# Patient Record
Sex: Female | Born: 1960
Health system: Southern US, Community
[De-identification: ages and names within clinical notes are randomized; demographics above are authoritative.]

## PROBLEM LIST (undated history)

## (undated) ENCOUNTER — Ambulatory Visit (HOSPITAL_COMMUNITY): Disposition: A | Discharge status: Other Acute Inpatient Hospital | Payer: BLUE CROSS/BLUE SHIELD

## (undated) DIAGNOSIS — R221 Localized swelling, mass and lump, neck: Secondary | ICD-10-CM

## (undated) DIAGNOSIS — I10 Essential (primary) hypertension: Secondary | ICD-10-CM

## (undated) DIAGNOSIS — F329 Major depressive disorder, single episode, unspecified: Secondary | ICD-10-CM

## (undated) DIAGNOSIS — E7800 Pure hypercholesterolemia, unspecified: Secondary | ICD-10-CM

## (undated) DIAGNOSIS — F411 Generalized anxiety disorder: Secondary | ICD-10-CM

## (undated) DIAGNOSIS — D509 Iron deficiency anemia, unspecified: Secondary | ICD-10-CM

## (undated) DIAGNOSIS — J984 Other disorders of lung: Secondary | ICD-10-CM

## (undated) DIAGNOSIS — R220 Localized swelling, mass and lump, head: Secondary | ICD-10-CM

## (undated) DIAGNOSIS — M1990 Unspecified osteoarthritis, unspecified site: Secondary | ICD-10-CM

## (undated) HISTORY — DX: Other disorders of lung: J98.4

## (undated) HISTORY — DX: Localized swelling, mass and lump, neck: R22.1

## (undated) HISTORY — DX: Iron deficiency anemia, unspecified: D50.9

## (undated) HISTORY — PX: TUBAL LIGATION: SHX77

## (undated) HISTORY — DX: Generalized anxiety disorder: F41.1

## (undated) HISTORY — DX: Localized swelling, mass and lump, head: R22.0

## (undated) HISTORY — DX: Pure hypercholesterolemia, unspecified: E78.00

## (undated) HISTORY — DX: Essential (primary) hypertension: I10

## (undated) HISTORY — DX: Major depressive disorder, single episode, unspecified: F32.9

## (undated) HISTORY — DX: Unspecified osteoarthritis, unspecified site: M19.90

## (undated) HISTORY — PX: BUNIONECTOMY: SHX129

## (undated) HISTORY — PX: OTHER SURGICAL HISTORY: SHX169

---

## 1997-06-22 ENCOUNTER — Ambulatory Visit (HOSPITAL_COMMUNITY): Admission: RE | Admit: 1997-06-22 | Discharge: 1997-06-22 | Payer: Self-pay | Admitting: Endocrinology

## 1997-12-22 ENCOUNTER — Emergency Department (HOSPITAL_COMMUNITY): Admission: EM | Admit: 1997-12-22 | Discharge: 1997-12-22 | Payer: Self-pay | Admitting: Emergency Medicine

## 1998-03-27 ENCOUNTER — Emergency Department (HOSPITAL_COMMUNITY): Admission: EM | Admit: 1998-03-27 | Discharge: 1998-03-28 | Payer: Self-pay | Admitting: Emergency Medicine

## 1998-03-28 ENCOUNTER — Encounter: Payer: Self-pay | Admitting: Emergency Medicine

## 1998-06-13 ENCOUNTER — Other Ambulatory Visit: Admission: RE | Admit: 1998-06-13 | Discharge: 1998-06-13 | Payer: Self-pay | Admitting: Family Medicine

## 1998-07-05 ENCOUNTER — Encounter: Payer: Self-pay | Admitting: Endocrinology

## 1998-07-05 ENCOUNTER — Ambulatory Visit (HOSPITAL_COMMUNITY): Admission: RE | Admit: 1998-07-05 | Discharge: 1998-07-05 | Payer: Self-pay | Admitting: Pediatrics

## 1999-04-05 ENCOUNTER — Ambulatory Visit (HOSPITAL_COMMUNITY): Admission: RE | Admit: 1999-04-05 | Discharge: 1999-04-05 | Payer: Self-pay | Admitting: Obstetrics and Gynecology

## 1999-07-09 ENCOUNTER — Ambulatory Visit (HOSPITAL_COMMUNITY): Admission: RE | Admit: 1999-07-09 | Discharge: 1999-07-09 | Payer: Self-pay | Admitting: Obstetrics and Gynecology

## 1999-07-09 ENCOUNTER — Encounter: Payer: Self-pay | Admitting: Obstetrics and Gynecology

## 1999-07-24 ENCOUNTER — Other Ambulatory Visit: Admission: RE | Admit: 1999-07-24 | Discharge: 1999-07-24 | Payer: Self-pay | Admitting: Obstetrics and Gynecology

## 2000-05-28 ENCOUNTER — Encounter: Payer: Self-pay | Admitting: Endocrinology

## 2000-05-28 ENCOUNTER — Ambulatory Visit (HOSPITAL_COMMUNITY): Admission: RE | Admit: 2000-05-28 | Discharge: 2000-05-28 | Payer: Self-pay | Admitting: Endocrinology

## 2000-07-10 ENCOUNTER — Encounter: Payer: Self-pay | Admitting: Obstetrics and Gynecology

## 2000-07-10 ENCOUNTER — Ambulatory Visit (HOSPITAL_COMMUNITY): Admission: RE | Admit: 2000-07-10 | Discharge: 2000-07-10 | Payer: Self-pay | Admitting: Obstetrics and Gynecology

## 2000-07-31 ENCOUNTER — Other Ambulatory Visit: Admission: RE | Admit: 2000-07-31 | Discharge: 2000-07-31 | Payer: Self-pay | Admitting: Obstetrics and Gynecology

## 2000-11-13 ENCOUNTER — Emergency Department (HOSPITAL_COMMUNITY): Admission: EM | Admit: 2000-11-13 | Discharge: 2000-11-13 | Payer: Self-pay | Admitting: Emergency Medicine

## 2001-07-13 ENCOUNTER — Encounter: Payer: Self-pay | Admitting: Obstetrics and Gynecology

## 2001-07-13 ENCOUNTER — Ambulatory Visit (HOSPITAL_COMMUNITY): Admission: RE | Admit: 2001-07-13 | Discharge: 2001-07-13 | Payer: Self-pay | Admitting: Obstetrics and Gynecology

## 2001-08-20 ENCOUNTER — Other Ambulatory Visit: Admission: RE | Admit: 2001-08-20 | Discharge: 2001-08-20 | Payer: Self-pay | Admitting: Obstetrics and Gynecology

## 2002-08-18 ENCOUNTER — Ambulatory Visit (HOSPITAL_COMMUNITY): Admission: RE | Admit: 2002-08-18 | Discharge: 2002-08-18 | Payer: Self-pay | Admitting: Obstetrics and Gynecology

## 2002-08-18 ENCOUNTER — Encounter: Payer: Self-pay | Admitting: Obstetrics and Gynecology

## 2003-08-10 ENCOUNTER — Other Ambulatory Visit: Admission: RE | Admit: 2003-08-10 | Discharge: 2003-08-10 | Payer: Self-pay | Admitting: Obstetrics and Gynecology

## 2003-08-24 ENCOUNTER — Ambulatory Visit (HOSPITAL_COMMUNITY): Admission: RE | Admit: 2003-08-24 | Discharge: 2003-08-24 | Payer: Self-pay | Admitting: Endocrinology

## 2004-09-19 ENCOUNTER — Ambulatory Visit (HOSPITAL_COMMUNITY): Admission: RE | Admit: 2004-09-19 | Discharge: 2004-09-19 | Payer: Self-pay | Admitting: Endocrinology

## 2005-05-06 ENCOUNTER — Emergency Department (HOSPITAL_COMMUNITY): Admission: EM | Admit: 2005-05-06 | Discharge: 2005-05-06 | Payer: Self-pay | Admitting: Emergency Medicine

## 2005-09-13 ENCOUNTER — Ambulatory Visit: Payer: Self-pay | Admitting: Endocrinology

## 2005-09-18 ENCOUNTER — Ambulatory Visit: Payer: Self-pay | Admitting: Endocrinology

## 2005-10-02 ENCOUNTER — Ambulatory Visit (HOSPITAL_COMMUNITY): Admission: RE | Admit: 2005-10-02 | Discharge: 2005-10-02 | Payer: Self-pay | Admitting: Endocrinology

## 2006-10-08 ENCOUNTER — Ambulatory Visit (HOSPITAL_COMMUNITY): Admission: RE | Admit: 2006-10-08 | Discharge: 2006-10-08 | Payer: Self-pay | Admitting: Endocrinology

## 2006-10-10 ENCOUNTER — Ambulatory Visit: Payer: Self-pay | Admitting: Endocrinology

## 2006-10-10 LAB — CONVERTED CEMR LAB
Albumin: 3.8 g/dL (ref 3.5–5.2)
Alkaline Phosphatase: 62 units/L (ref 39–117)
BUN: 9 mg/dL (ref 6–23)
Basophils Relative: 0.8 % (ref 0.0–1.0)
Bilirubin Urine: NEGATIVE
Calcium: 9.1 mg/dL (ref 8.4–10.5)
Chloride: 108 meq/L (ref 96–112)
Cholesterol: 216 mg/dL (ref 0–200)
Creatinine, Ser: 0.8 mg/dL (ref 0.4–1.2)
Eosinophils Absolute: 0.2 10*3/uL (ref 0.0–0.6)
Eosinophils Relative: 2.9 % (ref 0.0–5.0)
GFR calc Af Amer: 98 mL/min
GFR calc non Af Amer: 81 mL/min
Glucose, Bld: 88 mg/dL (ref 70–99)
HCT: 36.8 % (ref 36.0–46.0)
Ketones, ur: NEGATIVE mg/dL
MCV: 87.9 fL (ref 78.0–100.0)
Monocytes Absolute: 0.1 10*3/uL — ABNORMAL LOW (ref 0.2–0.7)
Monocytes Relative: 2.6 % — ABNORMAL LOW (ref 3.0–11.0)
Neutro Abs: 3.3 10*3/uL (ref 1.4–7.7)
Sodium: 139 meq/L (ref 135–145)
Total Bilirubin: 0.5 mg/dL (ref 0.3–1.2)
Total CHOL/HDL Ratio: 3.7
Total Protein: 7.5 g/dL (ref 6.0–8.3)
Urine Glucose: NEGATIVE mg/dL
pH: 6 (ref 5.0–8.0)

## 2006-10-15 ENCOUNTER — Ambulatory Visit: Payer: Self-pay | Admitting: Endocrinology

## 2007-02-21 ENCOUNTER — Encounter: Payer: Self-pay | Admitting: Endocrinology

## 2007-02-21 DIAGNOSIS — F32A Depression, unspecified: Secondary | ICD-10-CM | POA: Insufficient documentation

## 2007-02-21 DIAGNOSIS — F329 Major depressive disorder, single episode, unspecified: Secondary | ICD-10-CM | POA: Insufficient documentation

## 2007-02-21 DIAGNOSIS — F3289 Other specified depressive episodes: Secondary | ICD-10-CM

## 2007-02-21 DIAGNOSIS — F411 Generalized anxiety disorder: Secondary | ICD-10-CM

## 2007-02-21 DIAGNOSIS — M1990 Unspecified osteoarthritis, unspecified site: Secondary | ICD-10-CM | POA: Insufficient documentation

## 2007-02-21 DIAGNOSIS — F419 Anxiety disorder, unspecified: Secondary | ICD-10-CM | POA: Insufficient documentation

## 2007-02-21 HISTORY — DX: Other specified depressive episodes: F32.89

## 2007-02-21 HISTORY — DX: Unspecified osteoarthritis, unspecified site: M19.90

## 2007-02-21 HISTORY — DX: Generalized anxiety disorder: F41.1

## 2007-02-21 HISTORY — DX: Major depressive disorder, single episode, unspecified: F32.9

## 2007-10-01 ENCOUNTER — Ambulatory Visit: Payer: Self-pay | Admitting: Endocrinology

## 2007-10-01 DIAGNOSIS — R220 Localized swelling, mass and lump, head: Secondary | ICD-10-CM | POA: Insufficient documentation

## 2007-10-01 DIAGNOSIS — R221 Localized swelling, mass and lump, neck: Secondary | ICD-10-CM

## 2007-10-01 HISTORY — DX: Localized swelling, mass and lump, head: R22.0

## 2007-10-01 LAB — CONVERTED CEMR LAB
BUN: 7 mg/dL (ref 6–23)
Creatinine, Ser: 1 mg/dL (ref 0.4–1.2)

## 2007-10-05 ENCOUNTER — Ambulatory Visit: Payer: Self-pay | Admitting: Cardiovascular Disease

## 2007-10-15 ENCOUNTER — Ambulatory Visit (HOSPITAL_COMMUNITY): Admission: RE | Admit: 2007-10-15 | Discharge: 2007-10-15 | Payer: Self-pay | Admitting: Endocrinology

## 2007-11-30 ENCOUNTER — Ambulatory Visit: Payer: Self-pay | Admitting: Internal Medicine

## 2007-11-30 DIAGNOSIS — R5383 Other fatigue: Secondary | ICD-10-CM

## 2007-11-30 DIAGNOSIS — R5381 Other malaise: Secondary | ICD-10-CM | POA: Insufficient documentation

## 2007-12-02 ENCOUNTER — Ambulatory Visit: Payer: Self-pay | Admitting: Endocrinology

## 2007-12-03 LAB — CONVERTED CEMR LAB
Albumin: 3.7 g/dL (ref 3.5–5.2)
Alkaline Phosphatase: 74 units/L (ref 39–117)
BUN: 13 mg/dL (ref 6–23)
Basophils Absolute: 0.1 10*3/uL (ref 0.0–0.1)
Bilirubin Urine: NEGATIVE
CO2: 26 meq/L (ref 19–32)
Calcium: 9.2 mg/dL (ref 8.4–10.5)
Direct LDL: 146.3 mg/dL
Eosinophils Relative: 3.5 % (ref 0.0–5.0)
GFR calc Af Amer: 86 mL/min
GFR calc non Af Amer: 71 mL/min
Glucose, Bld: 84 mg/dL (ref 70–99)
Hemoglobin, Urine: NEGATIVE
Monocytes Relative: 5.9 % (ref 3.0–12.0)
Neutrophils Relative %: 50.3 % (ref 43.0–77.0)
Sodium: 140 meq/L (ref 135–145)
TSH: 1.11 microintl units/mL (ref 0.35–5.50)
Total Bilirubin: 0.4 mg/dL (ref 0.3–1.2)
Urine Glucose: NEGATIVE mg/dL

## 2007-12-08 ENCOUNTER — Ambulatory Visit: Payer: Self-pay | Admitting: Endocrinology

## 2007-12-08 ENCOUNTER — Telehealth (INDEPENDENT_AMBULATORY_CARE_PROVIDER_SITE_OTHER): Payer: Self-pay | Admitting: *Deleted

## 2007-12-08 DIAGNOSIS — E7800 Pure hypercholesterolemia, unspecified: Secondary | ICD-10-CM | POA: Insufficient documentation

## 2007-12-08 HISTORY — DX: Pure hypercholesterolemia, unspecified: E78.00

## 2008-04-05 ENCOUNTER — Encounter: Payer: Self-pay | Admitting: Internal Medicine

## 2008-04-05 DIAGNOSIS — J984 Other disorders of lung: Secondary | ICD-10-CM | POA: Insufficient documentation

## 2008-04-05 HISTORY — DX: Other disorders of lung: J98.4

## 2008-04-11 ENCOUNTER — Ambulatory Visit: Payer: Self-pay | Admitting: Internal Medicine

## 2008-04-11 LAB — CONVERTED CEMR LAB: Creatinine, Ser: 0.8 mg/dL (ref 0.4–1.2)

## 2008-10-17 ENCOUNTER — Ambulatory Visit (HOSPITAL_COMMUNITY): Admission: RE | Admit: 2008-10-17 | Discharge: 2008-10-17 | Payer: Self-pay | Admitting: Endocrinology

## 2009-01-20 ENCOUNTER — Ambulatory Visit: Payer: Self-pay | Admitting: Endocrinology

## 2009-01-20 LAB — CONVERTED CEMR LAB
ALT: 10 units/L (ref 0–35)
AST: 17 units/L (ref 0–37)
BUN: 7 mg/dL (ref 6–23)
Basophils Relative: 0.5 % (ref 0.0–3.0)
Bilirubin, Direct: 0 mg/dL (ref 0.0–0.3)
Chloride: 110 meq/L (ref 96–112)
Cholesterol: 159 mg/dL (ref 0–200)
Creatinine, Ser: 0.7 mg/dL (ref 0.4–1.2)
Eosinophils Relative: 5.4 % — ABNORMAL HIGH (ref 0.0–5.0)
GFR calc non Af Amer: 113.75 mL/min (ref >60)
Ketones, ur: NEGATIVE mg/dL
Leukocytes, UA: NEGATIVE
Monocytes Absolute: 0.5 10*3/uL (ref 0.1–1.0)
Neutrophils Relative %: 50.4 % (ref 43.0–77.0)
Platelets: 296 10*3/uL (ref 150.0–400.0)
Potassium: 4.1 meq/L (ref 3.5–5.1)
RBC: 4.13 M/uL (ref 3.87–5.11)
Sodium: 142 meq/L (ref 135–145)
Specific Gravity, Urine: 1.025 (ref 1.000–1.030)
Total Bilirubin: 0.6 mg/dL (ref 0.3–1.2)
Total Protein: 7.6 g/dL (ref 6.0–8.3)
Urobilinogen, UA: 0.2 (ref 0.0–1.0)
pH: 6 (ref 5.0–8.0)

## 2009-01-23 ENCOUNTER — Ambulatory Visit: Payer: Self-pay | Admitting: Endocrinology

## 2009-01-23 DIAGNOSIS — D509 Iron deficiency anemia, unspecified: Secondary | ICD-10-CM

## 2009-01-23 HISTORY — DX: Iron deficiency anemia, unspecified: D50.9

## 2009-02-01 ENCOUNTER — Ambulatory Visit: Payer: Self-pay | Admitting: Gastroenterology

## 2009-02-13 ENCOUNTER — Ambulatory Visit: Payer: Self-pay | Admitting: Gastroenterology

## 2009-03-17 ENCOUNTER — Ambulatory Visit: Payer: Self-pay | Admitting: Internal Medicine

## 2009-03-17 DIAGNOSIS — R05 Cough: Secondary | ICD-10-CM

## 2009-03-17 DIAGNOSIS — R059 Cough, unspecified: Secondary | ICD-10-CM | POA: Insufficient documentation

## 2009-03-20 LAB — CONVERTED CEMR LAB
Basophils Absolute: 0 10*3/uL (ref 0.0–0.1)
Basophils Relative: 0.3 % (ref 0.0–3.0)
Eosinophils Relative: 5.2 % — ABNORMAL HIGH (ref 0.0–5.0)
Hemoglobin: 11.8 g/dL — ABNORMAL LOW (ref 12.0–15.0)
Lymphocytes Relative: 34.2 % (ref 12.0–46.0)
Lymphs Abs: 2.8 10*3/uL (ref 0.7–4.0)
Monocytes Absolute: 0.4 10*3/uL (ref 0.1–1.0)
Monocytes Relative: 5.2 % (ref 3.0–12.0)
Platelets: 306 10*3/uL (ref 150.0–400.0)
RBC: 4.14 M/uL (ref 3.87–5.11)
RDW: 15.2 % — ABNORMAL HIGH (ref 11.5–14.6)

## 2009-04-06 ENCOUNTER — Encounter: Payer: Self-pay | Admitting: Internal Medicine

## 2009-04-10 ENCOUNTER — Ambulatory Visit: Payer: Self-pay | Admitting: Cardiology

## 2009-11-20 ENCOUNTER — Ambulatory Visit (HOSPITAL_COMMUNITY): Admission: RE | Admit: 2009-11-20 | Discharge: 2009-11-20 | Payer: Self-pay | Admitting: Endocrinology

## 2010-01-26 ENCOUNTER — Ambulatory Visit: Payer: Self-pay | Admitting: Endocrinology

## 2010-01-26 LAB — CONVERTED CEMR LAB
Basophils Relative: 0.9 % (ref 0.0–3.0)
Bilirubin, Direct: 0.1 mg/dL (ref 0.0–0.3)
Calcium: 9 mg/dL (ref 8.4–10.5)
Chloride: 104 meq/L (ref 96–112)
Cholesterol: 214 mg/dL — ABNORMAL HIGH (ref 0–200)
Creatinine, Ser: 0.8 mg/dL (ref 0.4–1.2)
Eosinophils Absolute: 0.4 10*3/uL (ref 0.0–0.7)
Eosinophils Relative: 5.9 % — ABNORMAL HIGH (ref 0.0–5.0)
HDL: 53.3 mg/dL (ref >39.00)
Hemoglobin: 13.1 g/dL (ref 12.0–15.0)
Leukocytes, UA: NEGATIVE
Lymphs Abs: 2.9 10*3/uL (ref 0.7–4.0)
Monocytes Absolute: 0.4 10*3/uL (ref 0.1–1.0)
Monocytes Relative: 6.9 % (ref 3.0–12.0)
Neutrophils Relative %: 41.4 % — ABNORMAL LOW (ref 43.0–77.0)
Nitrite: NEGATIVE
Platelets: 295 10*3/uL (ref 150.0–400.0)
Potassium: 4.3 meq/L (ref 3.5–5.1)
RBC: 4.4 M/uL (ref 3.87–5.11)
RDW: 15 % — ABNORMAL HIGH (ref 11.5–14.6)
Total Protein: 7.3 g/dL (ref 6.0–8.3)
Triglycerides: 88 mg/dL (ref 0.0–149.0)

## 2010-01-29 ENCOUNTER — Ambulatory Visit: Payer: Self-pay | Admitting: Endocrinology

## 2010-01-29 ENCOUNTER — Encounter: Payer: Self-pay | Admitting: Endocrinology

## 2010-01-29 DIAGNOSIS — I10 Essential (primary) hypertension: Secondary | ICD-10-CM

## 2010-01-29 HISTORY — DX: Essential (primary) hypertension: I10

## 2010-06-03 LAB — CONVERTED CEMR LAB
Basophils Relative: 0.7 % (ref 0.0–3.0)
Eosinophils Relative: 5.6 % — ABNORMAL HIGH (ref 0.0–5.0)
HCT: 35.4 % — ABNORMAL LOW (ref 36.0–46.0)
Hemoglobin: 11.7 g/dL — ABNORMAL LOW (ref 12.0–15.0)
Iron: 38 ug/dL — ABNORMAL LOW (ref 42–145)
Lymphocytes Relative: 33.9 % (ref 12.0–46.0)
Lymphs Abs: 2.3 10*3/uL (ref 0.7–4.0)
MCV: 84.4 fL (ref 78.0–100.0)
Monocytes Absolute: 0.5 10*3/uL (ref 0.1–1.0)
Monocytes Relative: 6.7 % (ref 3.0–12.0)
RBC: 4.19 M/uL (ref 3.87–5.11)
Saturation Ratios: 10.4 % — ABNORMAL LOW (ref 20.0–50.0)

## 2010-06-07 NOTE — Assessment & Plan Note (Signed)
Summary: Cpx / Bcbs / #/ cd   Vital Signs:  Patient profile:   50 year old female Height:      65 inches (165.10 cm) Weight:      283.38 pounds (128.81 kg) BMI:     47.33 O2 Sat:      97 % on Room air Temp:     98.5 degrees F (36.94 degrees C) oral Pulse rate:   94 / minute BP sitting:   152 / 88  (left arm) Cuff size:   large  Vitals Entered By: Brenton Grills MA (January 29, 2010 9:47 AM)  O2 Flow:  Room air CC: Physical/pt is no longer taking Tessalon/aj   Primary Provider:  Minus Breeding MD  CC:  Physical/pt is no longer taking Tessalon/aj.  History of Present Illness: here for regular wellness examination.  she's feeling pretty well in general, and does not smoke. alcohol is occasional.   Current Medications (verified): 1)  Tessalon Perles 100 Mg Caps (Benzonatate) .Marland Kitchen.. 1 By Mouth Three Times A Day As Needed For Cough  Allergies (verified): No Known Drug Allergies  Family History: Reviewed history from 11/30/2007 and no changes required. sister had breast cancer Dm with mother, brother, 2 sisters  Social History: Reviewed history from 11/30/2007 and no changes required. Married 3 children, 1 died premature work - Clinical research associate - [employer] Never Smoked Alcohol use-yes - rare  Review of Systems  The patient denies fever, weight loss, weight gain, vision loss, decreased hearing, chest pain, syncope, prolonged cough, headaches, abdominal pain, melena, hematochezia, severe indigestion/heartburn, hematuria, and suspicious skin lesions.    Physical Exam  General:  morbidly obese.   Head:  head: no deformity eyes: no periorbital swelling, no proptosis external nose and ears are normal mouth: no lesion seen Neck:  Supple without thyroid enlargement or tenderness.  Breasts:  sees gyn  Heart:  Regular rate and rhythm without murmurs or gallops noted. Normal S1,S2.   Abdomen:  abdomen is soft, nontender.  no hepatosplenomegaly.   not distended.  no  hernia  Rectal:  sees gyn  Genitalia:  sees gyn  Msk:  muscle bulk and strength are grossly normal.  no obvious joint swelling.  gait is normal and steady  Extremities:  no deformity.  no ulcer on the feet.  feet are of normal color and temp.  no edema  Neurologic:  cn 2-12 grossly intact.   readily moves all 4's.   sensation is intact to touch on the feet  Skin:  normal texture and temp.  no rash.  not diaphoretic  Cervical Nodes:  No significant adenopathy.  Psych:  Alert and cooperative; normal mood and affect; normal attention span and concentration.   Additional Exam:  SEPARATE EVALUATION FOLLOWS--EACH PROBLEM HERE IS NEW, NOT RESPONDING TO TREATMENT, OR POSES SIGNIFICANT RISK TO THE PATIENT'S HEALTH: HISTORY OF THE PRESENT ILLNESS: pt states she feels well in general, except for work-related anxiety PAST MEDICAL HISTORY reviewed and up to date today REVIEW OF SYSTEMS: denies sob PHYSICAL EXAMINATION: dorsalis pedis intact bilat.  no carotid bruit clear to auscultation.  no respiratory distress see vs page LAB/XRAY RESULTS: elev ldl is noted IMPRESSION: htn, needs increased rx dyslipidemia, needs increased rx PLAN: see instruction page    Impression & Recommendations:  Problem # 1:  ROUTINE GENERAL MEDICAL EXAM@HEALTH  CARE FACL (ICD-V70.0)  Other Orders: EKG w/ Interpretation (93000) Est. Patient Level III (16109) Est. Patient 40-64 years (60454)  Preventive Care Screening  gyn is dr cousins   Patient Instructions: 1)  you should take an aspirin every day, unless you have been advised by a doctor not to. 2)  good diet and exercise habits significanly improve the control of your blood pressure and cholesterol.  please let me know if you wish to be referred to a dietician.  you should see an eye doctor every year. 3)  let me know if you decide to take the medication for cholesterol and/or blood pressure. 4)  cc dr cousins. 5)  please consider these  measures for your health:  minimize alcohol.  do not use tobacco products.  have a colonoscopy at least every 10 years from age 24.  keep firearms safely stored.  always use seat belts.  have working smoke alarms in your home.  see an eye doctor and dentist regularly.  never drive under the influence of alcohol or drugs (including prescription drugs).

## 2010-11-06 ENCOUNTER — Other Ambulatory Visit: Payer: Self-pay | Admitting: Endocrinology

## 2010-11-06 DIAGNOSIS — Z1231 Encounter for screening mammogram for malignant neoplasm of breast: Secondary | ICD-10-CM

## 2010-11-26 ENCOUNTER — Ambulatory Visit (HOSPITAL_COMMUNITY)
Admission: RE | Admit: 2010-11-26 | Discharge: 2010-11-26 | Disposition: A | Discharge status: Home / Self Care | Payer: BC Managed Care – PPO | Source: Ambulatory Visit | Attending: Endocrinology | Admitting: Endocrinology

## 2010-11-26 DIAGNOSIS — Z1231 Encounter for screening mammogram for malignant neoplasm of breast: Secondary | ICD-10-CM

## 2011-01-30 ENCOUNTER — Other Ambulatory Visit: Payer: Self-pay | Admitting: *Deleted

## 2011-01-30 DIAGNOSIS — Z0000 Encounter for general adult medical examination without abnormal findings: Secondary | ICD-10-CM

## 2011-02-04 ENCOUNTER — Other Ambulatory Visit (INDEPENDENT_AMBULATORY_CARE_PROVIDER_SITE_OTHER): Payer: BC Managed Care – PPO

## 2011-02-04 ENCOUNTER — Other Ambulatory Visit: Payer: BC Managed Care – PPO

## 2011-02-04 DIAGNOSIS — Z0000 Encounter for general adult medical examination without abnormal findings: Secondary | ICD-10-CM

## 2011-02-04 LAB — HEPATIC FUNCTION PANEL
AST: 20 U/L (ref 0–37)
Albumin: 4 g/dL (ref 3.5–5.2)
Bilirubin, Direct: 0 mg/dL (ref 0.0–0.3)
Total Bilirubin: 0.2 mg/dL — ABNORMAL LOW (ref 0.3–1.2)

## 2011-02-04 LAB — CBC WITH DIFFERENTIAL/PLATELET
Hemoglobin: 13 g/dL (ref 12.0–15.0)
Lymphocytes Relative: 35.6 % (ref 12.0–46.0)
Lymphs Abs: 2.5 10*3/uL (ref 0.7–4.0)
MCHC: 32.5 g/dL (ref 30.0–36.0)
MCV: 88.1 fl (ref 78.0–100.0)
Neutro Abs: 3.6 10*3/uL (ref 1.4–7.7)
Neutrophils Relative %: 51.6 % (ref 43.0–77.0)
RBC: 4.55 Mil/uL (ref 3.87–5.11)
RDW: 15 % — ABNORMAL HIGH (ref 11.5–14.6)

## 2011-02-04 LAB — BASIC METABOLIC PANEL
CO2: 27 mEq/L (ref 19–32)
Calcium: 9.1 mg/dL (ref 8.4–10.5)
Chloride: 106 mEq/L (ref 96–112)
Creatinine, Ser: 0.9 mg/dL (ref 0.4–1.2)
GFR: 86.65 mL/min (ref >60.00)
Glucose, Bld: 85 mg/dL (ref 70–99)
Potassium: 4 mEq/L (ref 3.5–5.1)

## 2011-02-04 LAB — URINALYSIS, ROUTINE W REFLEX MICROSCOPIC
Bilirubin Urine: NEGATIVE
Hgb urine dipstick: NEGATIVE
Nitrite: NEGATIVE
Specific Gravity, Urine: 1.015 (ref 1.000–1.030)
Total Protein, Urine: NEGATIVE
Urobilinogen, UA: 0.2 (ref 0.0–1.0)
pH: 6 (ref 5.0–8.0)

## 2011-02-04 LAB — LIPID PANEL: Triglycerides: 54 mg/dL (ref 0.0–149.0)

## 2011-02-05 ENCOUNTER — Encounter: Payer: Self-pay | Admitting: Endocrinology

## 2011-02-05 ENCOUNTER — Ambulatory Visit (INDEPENDENT_AMBULATORY_CARE_PROVIDER_SITE_OTHER): Payer: BC Managed Care – PPO | Admitting: Endocrinology

## 2011-02-05 VITALS — BP 132/84 | HR 100 | Temp 97.9°F | Ht 65.0 in | Wt 282.0 lb

## 2011-02-05 DIAGNOSIS — J984 Other disorders of lung: Secondary | ICD-10-CM

## 2011-02-05 DIAGNOSIS — Z0000 Encounter for general adult medical examination without abnormal findings: Secondary | ICD-10-CM

## 2011-02-05 NOTE — Patient Instructions (Addendum)
please consider these measures for your health:  minimize alcohol.  do not use tobacco products.  have a colonoscopy at least every 10 years from age 52.  Women should have an annual mammogram from age 33.  keep firearms safely stored.  always use seat belts.  have working smoke alarms in your home.  see an eye doctor and dentist regularly.  never drive under the influence of alcohol or drugs (including prescription drugs).   good diet and exercise habits significanly improve the control of your diabetes.  please let me know if you wish to be referred to a dietician, or for weight-loss surgery.   Please return in 1 year.

## 2011-02-05 NOTE — Progress Notes (Signed)
Subjective:    Patient ID: Ashley Duncan, female    DOB: April 20, 1961, 50 y.o.   MRN: [id number]  HPI here for regular wellness examination.  He's feeling pretty well in general, except for chronic arthralgias of the knees.  chronic med probs are stable.  She does not want medication for cholesterol.  Gyn is dr cousins.  She still has intermittent menses.  She is in the process of a marital separation.   Past Medical History  Diagnosis Date  . HYPERCHOLESTEROLEMIA 12/08/2007  . ANEMIA, IRON DEFICIENCY 01/23/2009  . ANXIETY 02/21/2007  . DEPRESSION 02/21/2007  . HYPERTENSION 01/29/2010  . PULMONARY NODULE, RIGHT UPPER LOBE 04/05/2008  . OSTEOARTHRITIS 02/21/2007    C Spine, Bilateral knees, heel spurs, right [place] osteophyte on CT 10/2007  . SWELLING MASS OR LUMP IN HEAD AND NECK 10/01/2007    Past Surgical History  Procedure Date  . Tubal ligation     History   Social History  . Marital Status: Married    Spouse Name: N/A    Number of Children: 3  . Years of Education: N/A   Occupational History  . Collections Specialist     [employer]   Social History Main Topics  . Smoking status: Never Smoker   . Smokeless tobacco: Not on file  . Alcohol Use: Yes     rare  . Drug Use:   . Sexually Active:    Other Topics Concern  . Not on file   Social History Narrative   3 children, 1 died premature    No current outpatient prescriptions on file prior to visit.    No Known Allergies  Family History  Problem Relation Age of Onset  . Diabetes Mother   . Cancer Sister     Breast Cancer  . Diabetes Sister   . Diabetes Brother   . Diabetes Sister     BP 132/84  Pulse 100  Temp(Src) 97.9 F (36.6 C) (Oral)  Ht 5\' 5"  (1.651 m)  Wt 282 lb (127.914 kg)  BMI 46.93 kg/m2  SpO2 98%  LMP 09/08/2010    Review of Systems  Constitutional: Negative for fever and unexpected weight change.  HENT: Negative for hearing loss.   Eyes: Negative for visual disturbance.  Respiratory:  Negative for chest tightness.   Cardiovascular: Negative for chest pain.  Gastrointestinal: Negative for anal bleeding.  Genitourinary: Negative for hematuria.  Musculoskeletal: Negative for back pain.  Skin: Negative for rash.  Neurological: Negative for syncope and headaches.  Hematological: Does not bruise/bleed easily.  Psychiatric/Behavioral: Negative for dysphoric mood.       Objective:   Physical Exam VS: see vs page GEN: no distress HEAD: head: no deformity eyes: no periorbital swelling, no proptosis external nose and ears are normal mouth: no lesion seen NECK: supple, thyroid is not enlarged CHEST WALL: no deformity LUNGS:  Clear to auscultation BREASTS:  sees gyn CV: reg rate and rhythm, no murmur ABD: abdomen is soft, nontender.  no hepatosplenomegaly.  not distended.  no hernia GENITALIA:  sees gyn RECTAL: sees gyn MUSCULOSKELETAL: muscle bulk and strength are grossly normal.  no obvious joint swelling.  gait is normal and steady EXTEMITIES: no deformity.  no ulcer on the feet.  feet are of normal color and temp.  no edema PULSES: dorsalis pedis intact bilat.  no carotid bruit NEURO:  cn 2-12 grossly intact.   readily moves all 4's.  sensation is intact to touch on the feet SKIN:  Normal texture  and temperature.  No rash or suspicious lesion is visible.   NODES:  None palpable at the neck PSYCH: alert, oriented x3.  Does not appear anxious nor depressed.        Assessment & Plan:  Wellness visit today, with problems stable, except as noted.

## 2011-12-11 ENCOUNTER — Other Ambulatory Visit: Payer: Self-pay | Admitting: Endocrinology

## 2012-01-15 ENCOUNTER — Other Ambulatory Visit: Payer: Self-pay | Admitting: Endocrinology

## 2012-01-15 DIAGNOSIS — Z1231 Encounter for screening mammogram for malignant neoplasm of breast: Secondary | ICD-10-CM

## 2012-01-27 ENCOUNTER — Ambulatory Visit (HOSPITAL_COMMUNITY)
Admission: RE | Admit: 2012-01-27 | Discharge: 2012-01-27 | Disposition: A | Discharge status: Home / Self Care | Payer: Self-pay | Source: Ambulatory Visit | Attending: Endocrinology | Admitting: Endocrinology

## 2012-01-27 DIAGNOSIS — Z1231 Encounter for screening mammogram for malignant neoplasm of breast: Secondary | ICD-10-CM

## 2012-02-06 ENCOUNTER — Ambulatory Visit (INDEPENDENT_AMBULATORY_CARE_PROVIDER_SITE_OTHER): Payer: Self-pay | Admitting: Endocrinology

## 2012-02-06 ENCOUNTER — Encounter: Payer: Self-pay | Admitting: Endocrinology

## 2012-02-06 VITALS — BP 140/90 | HR 81 | Temp 98.2°F | Resp 16 | Wt 287.2 lb

## 2012-02-06 DIAGNOSIS — I10 Essential (primary) hypertension: Secondary | ICD-10-CM

## 2012-02-06 NOTE — Progress Notes (Signed)
  Subjective:    Patient ID: Ashley Duncan, female    DOB: 12-Sep-1960, 51 y.o.   MRN: [id number]  HPI Pt says she has neverrequired any medication for HTN.  pt states she feels well in general, except for anxiety.   Past Medical History  Diagnosis Date  . HYPERCHOLESTEROLEMIA 12/08/2007  . ANEMIA, IRON DEFICIENCY 01/23/2009  . ANXIETY 02/21/2007  . DEPRESSION 02/21/2007  . HYPERTENSION 01/29/2010  . PULMONARY NODULE, RIGHT UPPER LOBE 04/05/2008  . OSTEOARTHRITIS 02/21/2007    C Spine, Bilateral knees, heel spurs, right [place] osteophyte on CT 10/2007  . SWELLING MASS OR LUMP IN HEAD AND NECK 10/01/2007    Past Surgical History  Procedure Date  . Tubal ligation     History   Social History  . Marital Status: Married    Spouse Name: N/A    Number of Children: 3  . Years of Education: N/A   Occupational History  . Collections Specialist     [employer]   Social History Main Topics  . Smoking status: Never Smoker   . Smokeless tobacco: Not on file  . Alcohol Use: Yes     rare  . Drug Use:   . Sexually Active:    Other Topics Concern  . Not on file   Social History Narrative   3 children, 1 died premature    No current outpatient prescriptions on file prior to visit.    No Known Allergies  Family History  Problem Relation Age of Onset  . Diabetes Mother   . Cancer Sister     Breast Cancer  . Diabetes Sister   . Diabetes Brother   . Diabetes Sister     BP 140/90  Pulse 81  Temp 98.2 F (36.8 C) (Oral)  Resp 16  Wt 287 lb 4 oz (130.296 kg)  SpO2 96%  LMP 09/08/2010  Review of Systems Denies chest pain and sob.    Objective:   Physical Exam VITAL SIGNS:  See vs page GENERAL: no distress LUNGS:  Clear to auscultation HEART:  Regular rate and rhythm without murmurs noted. Normal S1,S2.      Assessment & Plan:  HTN.  She will probably need medication at some point, but she can try diet and exercise for now.

## 2012-02-06 NOTE — Patient Instructions (Addendum)
good diet and exercise significanly improve the control of your blood pressure.  Please come back when you get your insurance, or sooner if necessary.

## 2012-05-18 ENCOUNTER — Encounter: Payer: Self-pay | Admitting: Endocrinology

## 2012-05-18 ENCOUNTER — Ambulatory Visit (INDEPENDENT_AMBULATORY_CARE_PROVIDER_SITE_OTHER): Payer: Self-pay | Admitting: Endocrinology

## 2012-05-18 VITALS — BP 138/84 | HR 78 | Wt 285.0 lb

## 2012-05-18 DIAGNOSIS — R200 Anesthesia of skin: Secondary | ICD-10-CM | POA: Insufficient documentation

## 2012-05-18 DIAGNOSIS — IMO0001 Reserved for inherently not codable concepts without codable children: Secondary | ICD-10-CM

## 2012-05-18 DIAGNOSIS — M791 Myalgia, unspecified site: Secondary | ICD-10-CM

## 2012-05-18 DIAGNOSIS — R209 Unspecified disturbances of skin sensation: Secondary | ICD-10-CM

## 2012-05-18 LAB — CK: Total CK: 231 U/L — ABNORMAL HIGH (ref 7–177)

## 2012-05-18 NOTE — Patient Instructions (Addendum)
blood tests are being requested for you today.  We'll contact you with results.  

## 2012-05-18 NOTE — Progress Notes (Signed)
  Subjective:    Patient ID: Ashley Duncan, female    DOB: Apr 19, 1961, 52 y.o.   MRN: [id number]  HPI Pt states few mos of moderate numbness of both hands, and assoc pain.  She has difficulty with concentration and headache.  She thinks she has lyme dz, as she had several tick bites last summer.   Past Medical History  Diagnosis Date  . HYPERCHOLESTEROLEMIA 12/08/2007  . ANEMIA, IRON DEFICIENCY 01/23/2009  . ANXIETY 02/21/2007  . DEPRESSION 02/21/2007  . HYPERTENSION 01/29/2010  . PULMONARY NODULE, RIGHT UPPER LOBE 04/05/2008  . OSTEOARTHRITIS 02/21/2007    C Spine, Bilateral knees, heel spurs, right [place] osteophyte on CT 10/2007  . SWELLING MASS OR LUMP IN HEAD AND NECK 10/01/2007    Past Surgical History  Procedure Date  . Tubal ligation     History   Social History  . Marital Status: Married    Spouse Name: N/A    Number of Children: 3  . Years of Education: N/A   Occupational History  . Collections Specialist     [employer]   Social History Main Topics  . Smoking status: Never Smoker   . Smokeless tobacco: Not on file  . Alcohol Use: Yes     Comment: rare  . Drug Use:   . Sexually Active:    Other Topics Concern  . Not on file   Social History Narrative   3 children, 1 died premature    No current outpatient prescriptions on file prior to visit.    No Known Allergies  Family History  Problem Relation Age of Onset  . Diabetes Mother   . Cancer Sister     Breast Cancer  . Diabetes Sister   . Diabetes Brother   . Diabetes Sister     BP 138/84  Pulse 78  Wt 285 lb (129.275 kg)  SpO2 95%  LMP 09/08/2010  Review of Systems He has myalgias and nasal congestion, and low-grade fever.     Objective:   Physical Exam VITAL SIGNS:  See vs page GENERAL: no distress head: no deformity eyes: no periorbital swelling, no proptosis external nose and ears are normal mouth: no lesion seen Both eac's and tm's are normal.   LUNGS:  Clear to auscultation HEART:   Regular rate and rhythm without murmurs noted. Normal S1,S2.   Muscles: nontender Skin: no rash Neuro: sensation is intact to touch on the hands.    Lyme titer is neg    Assessment & Plan:  Hand pain, prob due to CTS.  Pt says that's what she thought it was. Myalgias, new.  uncertain etiology

## 2012-06-20 ENCOUNTER — Other Ambulatory Visit: Payer: Self-pay

## 2012-08-19 ENCOUNTER — Telehealth: Payer: Self-pay | Admitting: Endocrinology

## 2012-08-19 NOTE — Telephone Encounter (Signed)
recv'd records request from Disability. Forwarded by fax to HIM @[email or web address]  by fax# [phone] / Oneita Kras.

## 2013-03-11 ENCOUNTER — Other Ambulatory Visit: Payer: Self-pay

## 2013-07-12 ENCOUNTER — Other Ambulatory Visit: Payer: Self-pay | Admitting: Endocrinology

## 2013-07-12 DIAGNOSIS — Z1231 Encounter for screening mammogram for malignant neoplasm of breast: Secondary | ICD-10-CM

## 2013-07-14 ENCOUNTER — Other Ambulatory Visit: Payer: Self-pay

## 2013-07-14 ENCOUNTER — Other Ambulatory Visit (INDEPENDENT_AMBULATORY_CARE_PROVIDER_SITE_OTHER): Payer: BC Managed Care – PPO

## 2013-07-14 DIAGNOSIS — Z0000 Encounter for general adult medical examination without abnormal findings: Secondary | ICD-10-CM

## 2013-07-14 LAB — URINALYSIS, ROUTINE W REFLEX MICROSCOPIC
Bilirubin Urine: NEGATIVE
Hgb urine dipstick: NEGATIVE
Ketones, ur: NEGATIVE
LEUKOCYTES UA: NEGATIVE
NITRITE: NEGATIVE
RBC / HPF: NONE SEEN (ref 0–?)
SPECIFIC GRAVITY, URINE: 1.01 (ref 1.000–1.030)
Total Protein, Urine: NEGATIVE
UROBILINOGEN UA: 0.2 (ref 0.0–1.0)
Urine Glucose: NEGATIVE
WBC UA: NONE SEEN (ref 0–?)
pH: 7 (ref 5.0–8.0)

## 2013-07-14 LAB — BASIC METABOLIC PANEL
BUN: 12 mg/dL (ref 6–23)
CALCIUM: 9.1 mg/dL (ref 8.4–10.5)
CHLORIDE: 105 meq/L (ref 96–112)
CO2: 26 mEq/L (ref 19–32)
CREATININE: 0.9 mg/dL (ref 0.4–1.2)
GFR: 88.16 mL/min (ref >60.00)
Glucose, Bld: 86 mg/dL (ref 70–99)
Potassium: 3.7 mEq/L (ref 3.5–5.1)
SODIUM: 138 meq/L (ref 135–145)

## 2013-07-14 LAB — CBC WITH DIFFERENTIAL/PLATELET
BASOS PCT: 0.6 % (ref 0.0–3.0)
Basophils Absolute: 0 10*3/uL (ref 0.0–0.1)
EOS PCT: 5.7 % — AB (ref 0.0–5.0)
Eosinophils Absolute: 0.3 10*3/uL (ref 0.0–0.7)
HCT: 39.5 % (ref 36.0–46.0)
HEMOGLOBIN: 12.7 g/dL (ref 12.0–15.0)
LYMPHS PCT: 39.9 % (ref 12.0–46.0)
Lymphs Abs: 2.4 10*3/uL (ref 0.7–4.0)
MCHC: 32.1 g/dL (ref 30.0–36.0)
MCV: 88.8 fl (ref 78.0–100.0)
Monocytes Absolute: 0.4 10*3/uL (ref 0.1–1.0)
Monocytes Relative: 6.5 % (ref 3.0–12.0)
NEUTROS ABS: 2.9 10*3/uL (ref 1.4–7.7)
NEUTROS PCT: 47.3 % (ref 43.0–77.0)
Platelets: 282 10*3/uL (ref 150.0–400.0)
RBC: 4.45 Mil/uL (ref 3.87–5.11)
RDW: 14.3 % (ref 11.5–14.6)
WBC: 6.1 10*3/uL (ref 4.5–10.5)

## 2013-07-14 LAB — LIPID PANEL
CHOL/HDL RATIO: 4
Cholesterol: 197 mg/dL (ref 0–200)
HDL: 52 mg/dL (ref >39.00)
LDL CALC: 133 mg/dL — AB (ref 0–99)
Triglycerides: 58 mg/dL (ref 0.0–149.0)
VLDL: 11.6 mg/dL (ref 0.0–40.0)

## 2013-07-14 LAB — HEMOGLOBIN A1C: HEMOGLOBIN A1C: 6.1 % (ref 4.6–6.5)

## 2013-07-14 LAB — TSH: TSH: 1.27 u[IU]/mL (ref 0.35–5.50)

## 2013-07-15 ENCOUNTER — Other Ambulatory Visit: Payer: Self-pay

## 2013-07-15 LAB — HEPATIC FUNCTION PANEL (6)
ALBUMIN: 4.2 g/dL (ref 3.5–5.5)
ALK PHOS: 88 IU/L (ref 39–117)
ALT: 21 IU/L (ref 0–32)
AST: 27 IU/L (ref 0–40)
BILIRUBIN DIRECT: 0.07 mg/dL (ref 0.00–0.40)
Total Bilirubin: 0.2 mg/dL (ref 0.0–1.2)

## 2013-07-16 ENCOUNTER — Encounter: Payer: Self-pay | Admitting: Endocrinology

## 2013-07-16 ENCOUNTER — Ambulatory Visit (INDEPENDENT_AMBULATORY_CARE_PROVIDER_SITE_OTHER): Payer: BC Managed Care – PPO | Admitting: Endocrinology

## 2013-07-16 VITALS — BP 126/88 | HR 82 | Temp 98.1°F | Ht 65.0 in | Wt 270.0 lb

## 2013-07-16 DIAGNOSIS — R7309 Other abnormal glucose: Secondary | ICD-10-CM

## 2013-07-16 DIAGNOSIS — R739 Hyperglycemia, unspecified: Secondary | ICD-10-CM | POA: Insufficient documentation

## 2013-07-16 DIAGNOSIS — E7800 Pure hypercholesterolemia, unspecified: Secondary | ICD-10-CM

## 2013-07-16 NOTE — Patient Instructions (Signed)
please consider these measures for your health:  minimize alcohol.  do not use tobacco products.  have a colonoscopy at least every 10 years from age 55.  Women should have an annual mammogram from age 40.  keep firearms safely stored.  always use seat belts.  have working smoke alarms in your home.  see an eye doctor and dentist regularly.  never drive under the influence of alcohol or drugs (including prescription drugs).   Please return in 1 year. 

## 2013-07-16 NOTE — Progress Notes (Signed)
Subjective:    Patient ID: Ashley MiyamotoMattie Duncan, female    DOB: 02-22-1961, 53 y.o.   MRN: [id number]  HPI Pt is here for regular wellness examination, and is feeling pretty well in general, and says chronic med probs are stable, except as noted below.  She declined medication for cholesterol. Past Medical History  Diagnosis Date  . HYPERCHOLESTEROLEMIA 12/08/2007  . ANEMIA, IRON DEFICIENCY 01/23/2009  . ANXIETY 02/21/2007  . DEPRESSION 02/21/2007  . HYPERTENSION 01/29/2010  . PULMONARY NODULE, RIGHT UPPER LOBE 04/05/2008  . OSTEOARTHRITIS 02/21/2007    C Spine, Bilateral knees, heel spurs, right [place] osteophyte on CT 10/2007  . SWELLING MASS OR LUMP IN HEAD AND NECK 10/01/2007    Past Surgical History  Procedure Laterality Date  . Tubal ligation      History   Social History  . Marital Status: Married    Spouse Name: N/A    Number of Children: 3  . Years of Education: N/A   Occupational History  . Collections Specialist     [employer]   Social History Main Topics  . Smoking status: Never Smoker   . Smokeless tobacco: Not on file  . Alcohol Use: Yes     Comment: rare  . Drug Use:   . Sexual Activity:    Other Topics Concern  . Not on file   Social History Narrative   3 children, 1 died premature    No current outpatient prescriptions on file prior to visit.   No current facility-administered medications on file prior to visit.    No Known Allergies  Family History  Problem Relation Age of Onset  . Diabetes Mother   . Cancer Sister     Breast Cancer  . Diabetes Sister   . Diabetes Brother   . Diabetes Sister     BP 126/88  Pulse 82  Temp(Src) 98.1 F (36.7 C) (Oral)  Ht 5\' 5"  (1.651 m)  Wt 270 lb (122.471 kg)  BMI 44.93 kg/m2  SpO2 97%  LMP 09/08/2010  Review of Systems  Constitutional: Negative for fever.  HENT: Negative for hearing loss.   Eyes: Negative for visual disturbance.  Respiratory: Negative for shortness of breath.   Cardiovascular:  Negative for chest pain.  Gastrointestinal: Negative for anal bleeding.  Endocrine: Negative for cold intolerance.  Genitourinary: Negative for hematuria.  Musculoskeletal: Positive for back pain.  Skin: Negative for rash.  Allergic/Immunologic: Negative for environmental allergies.  Neurological: Negative for syncope.  Hematological: Does not bruise/bleed easily.  Psychiatric/Behavioral: Negative for dysphoric mood.       Objective:   Physical Exam VS: see vs page GEN: no distress HEAD: head: no deformity eyes: no periorbital swelling, no proptosis external nose and ears are normal mouth: no lesion seen NECK: supple, thyroid is not enlarged CHEST WALL: no deformity LUNGS:  Clear to auscultation BREASTS:  sees gyn CV: reg rate and rhythm, no murmur ABD: abdomen is soft, nontender.  no hepatosplenomegaly.  not distended.  no hernia GENITALIA/RECTAL: sees gyn MUSCULOSKELETAL: muscle bulk and strength are grossly normal.  no obvious joint swelling.  gait is normal and steady EXTEMITIES: no deformity.  no ulcer on the feet.  feet are of normal color and temp.  no edema PULSES: dorsalis pedis intact bilat.  no carotid bruit NEURO:  cn 2-12 grossly intact.   readily moves all 4's.  sensation is intact to touch on the feet SKIN:  Normal texture and temperature.  No rash or suspicious lesion is  visible.   NODES:  None palpable at the neck PSYCH: alert, well-oriented.  Does not appear anxious nor depressed.   ecg is declined    Assessment & Plan:  Wellness visit today, with problems stable, except as noted.

## 2013-07-21 ENCOUNTER — Ambulatory Visit (HOSPITAL_COMMUNITY)
Admission: RE | Admit: 2013-07-21 | Discharge: 2013-07-21 | Disposition: A | Discharge status: Home / Self Care | Payer: BC Managed Care – PPO | Source: Ambulatory Visit | Attending: Endocrinology | Admitting: Endocrinology

## 2013-07-21 ENCOUNTER — Encounter: Payer: Self-pay | Admitting: Endocrinology

## 2013-07-21 DIAGNOSIS — Z1231 Encounter for screening mammogram for malignant neoplasm of breast: Secondary | ICD-10-CM

## 2014-01-12 ENCOUNTER — Ambulatory Visit (INDEPENDENT_AMBULATORY_CARE_PROVIDER_SITE_OTHER): Payer: BC Managed Care – PPO | Admitting: Family Medicine

## 2014-01-12 VITALS — BP 136/84 | HR 68 | Temp 97.9°F | Resp 16 | Ht 65.0 in | Wt 259.2 lb

## 2014-01-12 DIAGNOSIS — S61409A Unspecified open wound of unspecified hand, initial encounter: Secondary | ICD-10-CM

## 2014-01-12 DIAGNOSIS — S61411A Laceration without foreign body of right hand, initial encounter: Secondary | ICD-10-CM

## 2014-01-12 NOTE — Addendum Note (Signed)
Addended by: Alonza Smoker on: 01/12/2014 08:22 PM   Modules accepted: Level of Service

## 2014-01-12 NOTE — Progress Notes (Signed)
  Ashley Duncan - 53 y.o. female MRN [id number]  Date of birth: [birth year]  SUBJECTIVE:  Including CC & ROS.  patient C/O: about 1 hour ago  Onset of symptoms: patient report she was cutting a watermelon when the knife slicked through the watermelon into the hyperthenar eminence  of the right hand.  Symptoms: pain, mild bleeding, mostly superfericial not deep, no significant bruising or redness Relieving factors: pressure elevation Worsened by: pressure and thumb movement   ROS:  Constitutional:  No fever, chills, or fatigue.  Review of systems otherwise negative except for what is stated in HPI  HISTORY: Past Medical, Surgical, Social, and Family History Reviewed & Updated per EMR. Pertinent Historical Findings include: HTN, non-smoker  PHYSICAL EXAM:  VS: BP:136/84 mmHg  HR:68bpm  TEMP:97.9 F (36.6 C)(Oral)  RESP:98 %  HT:5\' 5"  (165.1 cm)   WT:259 lb 3.2 oz (117.572 kg)  BMI:43.2 HAND EXAM: General: well nourished Skin of UE: warm; dry, no rashes, lesions, ecchymosis or erythema. Vascular: radial pulses 2+ bilaterally Neurologically: Sensation to light touch upper extremities equal and intact bilaterally.        Observation: no palmar or dorsal hand edema. mild swelling, no erythema, mild bruising over hyperthenar eminence   2.5 -3 cm superficial laceration to the mild substance of the hyperthenar eminence  Palpation: no tenderness over each carpal bone or metatarsal, radial styloid pain, no pain over the tendons sheath of each tunnel    ROM: normal ROM in supination and pronation, elbow extension and flexion    Strength: Normal 5/5 strength with extension/ flexion, pronation and supination.     No pain 5/5 strength of each digit in flexion and extension   Special test: stable medial and lateral collateral ligaments of each finger  Wound procedure: Date: 01/12/14  Confirmed: patient, side, site, safety procedures followed.    Performed by: Jed Limerick MARIE DO.      Informed consent:verbalized by patient  Indication: simple superficial laceration of the skin Location:  over the hyperthenar eminence Preparation and technique: skin prepped with soap and water, sterile preparation of site (with draped to expose affected area, eye protection, gloves and mask), local anesthesia 1% Lidocaine without epinephrine 2  Ml used, wound irrigation with low pressure.    Wound assessment:: characteristics (erythematous,no purulent), discharge (bloody small amount <1cc, yellow-colored no), inflammatory changes (redness no, swelling no), pain throbbing mild    Procedure tolerated: fairly.     ASSESSMENT & PLAN:  Simple superficial laceration of the right hyperthenar eminence skin no muscle involvement  Recommendation: - laceration repaired with 3 sutures patient tolerated procedure well.  - No soaking hand in water just wash with soap and water - Suture removal in 7-10 days -Precaution for infection given

## 2014-02-10 ENCOUNTER — Encounter: Payer: Self-pay | Admitting: Endocrinology

## 2014-03-09 ENCOUNTER — Ambulatory Visit (INDEPENDENT_AMBULATORY_CARE_PROVIDER_SITE_OTHER): Payer: BC Managed Care – PPO

## 2014-03-09 DIAGNOSIS — Z23 Encounter for immunization: Secondary | ICD-10-CM

## 2014-04-14 ENCOUNTER — Encounter: Payer: Self-pay | Admitting: Endocrinology

## 2014-09-09 ENCOUNTER — Other Ambulatory Visit: Payer: Self-pay | Admitting: Endocrinology

## 2014-09-09 DIAGNOSIS — Z1231 Encounter for screening mammogram for malignant neoplasm of breast: Secondary | ICD-10-CM

## 2014-09-12 ENCOUNTER — Ambulatory Visit (HOSPITAL_COMMUNITY)
Admission: RE | Admit: 2014-09-12 | Discharge: 2014-09-12 | Disposition: A | Discharge status: Home / Self Care | Payer: 59 | Source: Ambulatory Visit | Attending: Endocrinology | Admitting: Endocrinology

## 2014-09-12 DIAGNOSIS — Z1231 Encounter for screening mammogram for malignant neoplasm of breast: Secondary | ICD-10-CM

## 2014-10-06 ENCOUNTER — Telehealth: Payer: Self-pay

## 2014-10-06 ENCOUNTER — Other Ambulatory Visit (INDEPENDENT_AMBULATORY_CARE_PROVIDER_SITE_OTHER): Payer: 59

## 2014-10-06 DIAGNOSIS — Z0189 Encounter for other specified special examinations: Secondary | ICD-10-CM

## 2014-10-06 DIAGNOSIS — R739 Hyperglycemia, unspecified: Secondary | ICD-10-CM

## 2014-10-06 DIAGNOSIS — Z0000 Encounter for general adult medical examination without abnormal findings: Secondary | ICD-10-CM

## 2014-10-06 LAB — CBC WITH DIFFERENTIAL/PLATELET
BASOS ABS: 0 10*3/uL (ref 0.0–0.1)
BASOS PCT: 0.7 % (ref 0.0–3.0)
EOS ABS: 0.3 10*3/uL (ref 0.0–0.7)
Eosinophils Relative: 4.3 % (ref 0.0–5.0)
HEMATOCRIT: 38.2 % (ref 36.0–46.0)
HEMOGLOBIN: 12.5 g/dL (ref 12.0–15.0)
LYMPHS PCT: 48.3 % — AB (ref 12.0–46.0)
Lymphs Abs: 3 10*3/uL (ref 0.7–4.0)
MCHC: 32.7 g/dL (ref 30.0–36.0)
MCV: 87.7 fl (ref 78.0–100.0)
MONO ABS: 0.4 10*3/uL (ref 0.1–1.0)
MONOS PCT: 6.2 % (ref 3.0–12.0)
NEUTROS PCT: 40.5 % — AB (ref 43.0–77.0)
Neutro Abs: 2.5 10*3/uL (ref 1.4–7.7)
PLATELETS: 280 10*3/uL (ref 150.0–400.0)
RBC: 4.36 Mil/uL (ref 3.87–5.11)
RDW: 14.5 % (ref 11.5–15.5)
WBC: 6.3 10*3/uL (ref 4.0–10.5)

## 2014-10-06 LAB — BASIC METABOLIC PANEL
BUN: 11 mg/dL (ref 6–23)
CALCIUM: 9.2 mg/dL (ref 8.4–10.5)
CHLORIDE: 105 meq/L (ref 96–112)
CO2: 28 mEq/L (ref 19–32)
Creatinine, Ser: 0.83 mg/dL (ref 0.40–1.20)
GFR: 91.44 mL/min (ref >60.00)
Glucose, Bld: 81 mg/dL (ref 70–99)
Potassium: 3.9 mEq/L (ref 3.5–5.1)
SODIUM: 138 meq/L (ref 135–145)

## 2014-10-06 LAB — URINALYSIS, ROUTINE W REFLEX MICROSCOPIC
BILIRUBIN URINE: NEGATIVE
HGB URINE DIPSTICK: NEGATIVE
KETONES UR: NEGATIVE
LEUKOCYTES UA: NEGATIVE
NITRITE: NEGATIVE
PH: 7 (ref 5.0–8.0)
RBC / HPF: NONE SEEN (ref 0–?)
Specific Gravity, Urine: 1.02 (ref 1.000–1.030)
Total Protein, Urine: NEGATIVE
UROBILINOGEN UA: 0.2 (ref 0.0–1.0)
Urine Glucose: NEGATIVE
WBC, UA: NONE SEEN (ref 0–?)

## 2014-10-06 LAB — HEPATIC FUNCTION PANEL
ALK PHOS: 74 U/L (ref 39–117)
ALT: 17 U/L (ref 0–35)
AST: 26 U/L (ref 0–37)
Albumin: 4 g/dL (ref 3.5–5.2)
BILIRUBIN DIRECT: 0.1 mg/dL (ref 0.0–0.3)
BILIRUBIN TOTAL: 0.3 mg/dL (ref 0.2–1.2)
Total Protein: 7.3 g/dL (ref 6.0–8.3)

## 2014-10-06 LAB — LIPID PANEL
CHOL/HDL RATIO: 3
CHOLESTEROL: 187 mg/dL (ref 0–200)
HDL: 53.7 mg/dL (ref >39.00)
LDL CALC: 121 mg/dL — AB (ref 0–99)
NonHDL: 133.3
TRIGLYCERIDES: 62 mg/dL (ref 0.0–149.0)
VLDL: 12.4 mg/dL (ref 0.0–40.0)

## 2014-10-06 LAB — TSH: TSH: 1.94 u[IU]/mL (ref 0.35–4.50)

## 2014-10-06 LAB — HEMOGLOBIN A1C: HEMOGLOBIN A1C: 5.7 % (ref 4.6–6.5)

## 2014-10-06 NOTE — Telephone Encounter (Signed)
Patient is coming in for labs today at 9:45. Please advise what orders should be placed. Thanks!

## 2014-10-06 NOTE — Telephone Encounter (Signed)
i ordered

## 2014-10-10 ENCOUNTER — Ambulatory Visit (INDEPENDENT_AMBULATORY_CARE_PROVIDER_SITE_OTHER): Payer: 59 | Admitting: Endocrinology

## 2014-10-10 ENCOUNTER — Ambulatory Visit
Admission: RE | Admit: 2014-10-10 | Discharge: 2014-10-10 | Disposition: A | Discharge status: Home / Self Care | Payer: 59 | Source: Ambulatory Visit | Attending: Endocrinology | Admitting: Endocrinology

## 2014-10-10 ENCOUNTER — Encounter: Payer: Self-pay | Admitting: Endocrinology

## 2014-10-10 VITALS — BP 136/88 | HR 80 | Temp 98.4°F | Ht 65.0 in | Wt 265.0 lb

## 2014-10-10 DIAGNOSIS — R739 Hyperglycemia, unspecified: Secondary | ICD-10-CM

## 2014-10-10 DIAGNOSIS — K8020 Calculus of gallbladder without cholecystitis without obstruction: Secondary | ICD-10-CM

## 2014-10-10 DIAGNOSIS — M25561 Pain in right knee: Secondary | ICD-10-CM | POA: Diagnosis not present

## 2014-10-10 DIAGNOSIS — Z0000 Encounter for general adult medical examination without abnormal findings: Secondary | ICD-10-CM

## 2014-10-10 NOTE — Patient Instructions (Addendum)
please consider these measures for your health:  minimize alcohol.  do not use tobacco products.  have a colonoscopy at least every 10 years from age 56.  Women should have an annual mammogram from age 56.  keep firearms safely stored.  always use seat belts.  have working smoke alarms in your home.  see an eye doctor and dentist regularly.  never drive under the influence of alcohol or drugs (including prescription drugs).   X-rays are requested for you today.  We'll let you know about the results. Please let me know if you want to see a specialist for your knee. Please return in 1 year.

## 2014-10-10 NOTE — Progress Notes (Signed)
Subjective:    Patient ID: Ashley Duncan, female    DOB: [birth year], 54 y.o.   MRN: [id number]  HPI Pt is here for regular wellness examination, and is feeling pretty well in general, and says chronic med probs are stable, except as noted below Past Medical History  Diagnosis Date  . HYPERCHOLESTEROLEMIA 12/08/2007  . ANEMIA, IRON DEFICIENCY 01/23/2009  . ANXIETY 02/21/2007  . DEPRESSION 02/21/2007  . HYPERTENSION 01/29/2010  . PULMONARY NODULE, RIGHT UPPER LOBE 04/05/2008  . OSTEOARTHRITIS 02/21/2007    C Spine, Bilateral knees, heel spurs, right [place] osteophyte on CT 10/2007  . SWELLING MASS OR LUMP IN HEAD AND NECK 10/01/2007    Past Surgical History  Procedure Laterality Date  . Tubal ligation      History   Social History  . Marital Status: Married    Spouse Name: N/A  . Number of Children: 3  . Years of Education: N/A   Occupational History  . Collections Specialist     [employer]   Social History Main Topics  . Smoking status: Never Smoker   . Smokeless tobacco: Not on file  . Alcohol Use: Yes     Comment: rare  . Drug Use: Not on file  . Sexual Activity: Not on file   Other Topics Concern  . Not on file   Social History Narrative   3 children, 1 died premature    No current outpatient prescriptions on file prior to visit.   No current facility-administered medications on file prior to visit.    No Known Allergies  Family History  Problem Relation Age of Onset  . Diabetes Mother   . Cancer Sister     Breast Cancer  . Diabetes Sister   . Diabetes Brother   . Diabetes Sister     BP 136/88 mmHg  Pulse 80  Temp(Src) 98.4 F (36.9 C) (Oral)  Ht  (1.651 m)  Wt 265 lb (120.203 kg)  BMI 44.10 kg/m2  SpO2 95%  LMP 09/08/2010     Review of Systems  Constitutional: Negative for unexpected weight change.  HENT: Negative for hearing loss.   Eyes: Negative for visual disturbance.  Respiratory: Negative for shortness of breath.     Cardiovascular: Negative for chest pain.  Endocrine: Negative for cold intolerance.  Musculoskeletal: Negative for gait problem.  Skin: Negative for rash.  Allergic/Immunologic: Negative for environmental allergies.  Neurological: Negative for syncope.  Hematological: Does not bruise/bleed easily.  Psychiatric/Behavioral: Negative for dysphoric mood.       Objective:   Physical Exam VS: see vs page GEN: no distress HEAD: head: no deformity eyes: no periorbital swelling, no proptosis external nose and ears are normal mouth: no lesion seen NECK: supple, thyroid is not enlarged CHEST WALL: no deformity LUNGS:  Clear to auscultation BREASTS: sees gyn CV: reg rate and rhythm, no murmur ABD: abdomen is soft, nontender.  no hepatosplenomegaly.  not distended.  no hernia GENITALIA./RECTAL: sees gyn MUSCULOSKELETAL: muscle bulk and strength are grossly normal.  no obvious joint swelling.  EXTEMITIES: no deformity.  no ulcer on the feet.  feet are of normal color and temp.  no edema PULSES: dorsalis pedis intact bilat.  no carotid bruit NEURO:  cn 2-12 grossly intact.   readily moves all 4's.  sensation is intact to touch on the feet SKIN:  Normal texture and temperature.  No rash or suspicious lesion is visible.   NODES:  None palpable at the neck PSYCH: alert, well-oriented.  Does not appear anxious nor depressed.   ecg and DEXA are declined    Assessment & Plan:  Wellness visit today, with problems stable, except as noted.   SEPARATE EVALUATION FOLLOWS--EACH PROBLEM HERE IS NEW, NOT RESPONDING TO TREATMENT, OR POSES SIGNIFICANT RISK TO THE PATIENT'S HEALTH: HISTORY OF THE PRESENT ILLNESS:  Pt sates few years of moderate pain at the right knee, but no assoc numbness.  PAST MEDICAL HISTORY reviewed and up to date today REVIEW OF SYSTEMS: Denies fever PHYSICAL EXAMINATION: VITAL SIGNS:  See vs page GENERAL: no distress Right knee is normal Gait: slightly favors  RLE LAB/XRAY RESULTS: X-ray: OA IMPRESSION: Knee pain, worse PLAN:  Please let me know if you want to see a specialist for your knee (pt declines for now).

## 2015-01-17 ENCOUNTER — Telehealth: Payer: Self-pay | Admitting: Endocrinology

## 2015-01-17 NOTE — Telephone Encounter (Signed)
11:15 am tomorrow

## 2015-01-17 NOTE — Telephone Encounter (Signed)
See note below and please advise if the pt can be added to the schedule.

## 2015-01-17 NOTE — Telephone Encounter (Signed)
Pt.notified

## 2015-01-17 NOTE — Telephone Encounter (Signed)
Patient called stating that she has a cold and it feels as if she has fluid in her chest I offered to give her a scheduled appointment on Thursday she declined and would like to be seen the latest tomorrow    Please advise   Thank You

## 2015-01-18 NOTE — Telephone Encounter (Signed)
Pt called and scheduled for 01/19/2015.

## 2015-01-19 ENCOUNTER — Ambulatory Visit (INDEPENDENT_AMBULATORY_CARE_PROVIDER_SITE_OTHER): Payer: 59 | Admitting: Endocrinology

## 2015-01-19 ENCOUNTER — Encounter: Payer: Self-pay | Admitting: Endocrinology

## 2015-01-19 ENCOUNTER — Ambulatory Visit
Admission: RE | Admit: 2015-01-19 | Discharge: 2015-01-19 | Disposition: A | Discharge status: Home / Self Care | Payer: 59 | Source: Ambulatory Visit | Attending: Endocrinology | Admitting: Endocrinology

## 2015-01-19 ENCOUNTER — Ambulatory Visit: Payer: 59 | Admitting: Endocrinology

## 2015-01-19 VITALS — BP 137/88 | HR 86 | Temp 98.5°F | Ht 65.0 in | Wt 266.0 lb

## 2015-01-19 DIAGNOSIS — R05 Cough: Secondary | ICD-10-CM

## 2015-01-19 DIAGNOSIS — R059 Cough, unspecified: Secondary | ICD-10-CM

## 2015-01-19 MED ORDER — CEFUROXIME AXETIL 250 MG PO TABS: 250.0000 mg | ORAL_TABLET | Freq: Two times a day (BID) | ORAL | Status: AC

## 2015-01-19 MED ORDER — FLUTICASONE-SALMETEROL 100-50 MCG/DOSE IN AEPB: 1.0000 | INHALATION_SPRAY | Freq: Two times a day (BID) | RESPIRATORY_TRACT | Status: DC

## 2015-01-19 NOTE — Progress Notes (Signed)
   Subjective:    Patient ID: Ashley Duncan, female    DOB: [birth year], 54 y.o.   MRN: [id number]  HPI Pt states 1 month of dry-quality cough, and assoc wheezing.  Past Medical History  Diagnosis Date  . HYPERCHOLESTEROLEMIA 12/08/2007  . ANEMIA, IRON DEFICIENCY 01/23/2009  . ANXIETY 02/21/2007  . DEPRESSION 02/21/2007  . HYPERTENSION 01/29/2010  . PULMONARY NODULE, RIGHT UPPER LOBE 04/05/2008  . OSTEOARTHRITIS 02/21/2007    C Spine, Bilateral knees, heel spurs, right [place] osteophyte on CT 10/2007  . SWELLING MASS OR LUMP IN HEAD AND NECK 10/01/2007    Past Surgical History  Procedure Laterality Date  . Tubal ligation      Social History   Social History  . Marital Status: Married    Spouse Name: N/A  . Number of Children: 3  . Years of Education: N/A   Occupational History  . Collections Specialist     [employer]   Social History Main Topics  . Smoking status: Never Smoker   . Smokeless tobacco: Not on file  . Alcohol Use: Yes     Comment: rare  . Drug Use: Not on file  . Sexual Activity: Not on file   Other Topics Concern  . Not on file   Social History Narrative   3 children, 1 died premature    No current outpatient prescriptions on file prior to visit.   No current facility-administered medications on file prior to visit.    No Known Allergies  Family History  Problem Relation Age of Onset  . Diabetes Mother   . Cancer Sister     Breast Cancer  . Diabetes Sister   . Diabetes Brother   . Diabetes Sister     BP 137/88 mmHg  Pulse 86  Temp(Src) 98.5 F (36.9 C) (Oral)  Ht  (1.651 m)  Wt 266 lb (120.657 kg)  BMI 44.26 kg/m2  SpO2 95%  LMP 09/08/2010    Review of Systems Denies fever, but she has nasal congestion.    Objective:   Physical Exam VITAL SIGNS:  See vs page GENERAL: no distress head: no deformity eyes: no periorbital swelling, no proptosis external nose and ears are normal mouth: no lesion seen Both eac's and tm's are  normal LUNGS:  Clear to auscultation     Assessment & Plan:  URI, new   Patient is advised the following: Patient Instructions  A chest-x-ray is requested for you today.  We'll let you know about the results. i have sent 2 prescriptions to your pharmacy: antibiotic and inhaler. Loratadine-d (non-prescription) will help your congestion.   I hope you feel better soon.  If you don't feel better in 2 weeks, please call back.  Please call sooner if you get worse.

## 2015-01-19 NOTE — Patient Instructions (Addendum)
A chest-x-ray is requested for you today.  We'll let you know about the results. i have sent 2 prescriptions to your pharmacy: antibiotic and inhaler. Loratadine-d (non-prescription) will help your congestion.   I hope you feel better soon.  If you don't feel better in 2 weeks, please call back.  Please call sooner if you get worse.

## 2015-07-10 ENCOUNTER — Ambulatory Visit (INDEPENDENT_AMBULATORY_CARE_PROVIDER_SITE_OTHER): Payer: Worker's Compensation | Admitting: Family Medicine

## 2015-07-10 VITALS — BP 146/88 | HR 69 | Temp 98.4°F | Resp 18 | Ht 65.0 in | Wt 266.0 lb

## 2015-07-10 DIAGNOSIS — X503XXA Overexertion from repetitive movements, initial encounter: Secondary | ICD-10-CM

## 2015-07-10 DIAGNOSIS — M659 Synovitis and tenosynovitis, unspecified: Secondary | ICD-10-CM

## 2015-07-10 DIAGNOSIS — S161XXA Strain of muscle, fascia and tendon at neck level, initial encounter: Secondary | ICD-10-CM | POA: Diagnosis not present

## 2015-07-10 MED ORDER — DICLOFENAC SODIUM 75 MG PO TBEC: 75.0000 mg | DELAYED_RELEASE_TABLET | Freq: Two times a day (BID) | ORAL | Status: DC

## 2015-07-10 NOTE — Progress Notes (Signed)
   Subjective:    Patient ID: Ashley Duncan, female    DOB: 1961/02/04, 55 y.o.   MRN: [id number] By signing my name below, I, Ashley Duncan, attest that this documentation has been prepared under the direction and in the presence of [name], MD.  Electronically Signed: [name], Medical Scribe. 07/10/2015. 12:33 PM.  HPI HPI Comments: Ashley MiyamotoMattie Duncan is a 55 y.o. female with a history of osteoarthritis who presents to the [facility] complaining of sudden onset posterior neck pain radiating into her left shoulder that started 2 days ago due to an injury at work. 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 She tried Tylenol yesterday which provided minimal relief. NKDA. She is not on any regular medications.  Patient also reports having right wrist pain that started a few weeks ago, but worsened when she went to lift the patient. She did feel a shock at that time.  She also has back pain and states she has a bad back.  Review of Systems  Musculoskeletal: Positive for back pain, arthralgias and neck pain.       Objective:   Physical Exam CONSTITUTIONAL: Well developed/well nourished HEAD: Normocephalic/atraumatic EYES: EOM/PERRL ENMT: Mucous membranes moist NECK: supple no meningeal signs SPINE: entire spine nontender except left base of neck CV: S1/S2 noted, no murmurs/rubs/gallops noted LUNGS: Lungs are clear to auscultation bilaterally, no apparent distress ABDOMEN: soft, nontender, no rebound or guarding GU: no cva tenderness NEURO: Pt is awake/alert, moves all extremitiesx4. Normal reflexes. Normal muscle strength. EXTREMITIES: pulses normal, full ROM. Tender over her right radial nerve where it crosses the radial styloid. SKIN: warm, color normal PSYCH: no abnormalities of mood noted        Assessment & Plan:   This chart was scribed in my presence and reviewed by me personally.    ICD-9-CM ICD-10-CM   1. Repetitive  strain injury of cervical spine, initial encounter 847.0 S16.1XXA diclofenac (VOLTAREN) 75 MG EC tablet  2. Synovitis and tenosynovitis 727.00 M65.9 diclofenac (VOLTAREN) 75 MG EC tablet     Signed, Elvina SidleKurt Teniyah Seivert, MD

## 2015-07-10 NOTE — Patient Instructions (Signed)
Please pickup Capsaicin cream for the right wrist inflammation at apply it twice a day.  I would like to see you back in 48 hours if you're not significantly better with both problems

## 2015-07-15 ENCOUNTER — Ambulatory Visit (INDEPENDENT_AMBULATORY_CARE_PROVIDER_SITE_OTHER): Payer: Worker's Compensation | Admitting: Family Medicine

## 2015-07-15 ENCOUNTER — Encounter: Payer: Self-pay | Admitting: *Deleted

## 2015-07-15 VITALS — BP 128/92 | HR 65 | Temp 97.6°F | Resp 16 | Ht 65.0 in | Wt 269.0 lb

## 2015-07-15 DIAGNOSIS — S161XXA Strain of muscle, fascia and tendon at neck level, initial encounter: Secondary | ICD-10-CM | POA: Diagnosis not present

## 2015-07-15 DIAGNOSIS — X503XXA Overexertion from repetitive movements, initial encounter: Secondary | ICD-10-CM

## 2015-07-15 NOTE — Patient Instructions (Signed)
     IF you received an x-ray today, you will receive an invoice from [facility]. Please contact [facility] at [phone] with questions or concerns regarding your invoice.   IF you received labwork today, you will receive an invoice from [facility]. Please contact [facility] at [phone] with questions or concerns regarding your invoice.   Our billing staff will not be able to assist you with questions regarding bills from these companies.  You will be contacted with the lab results as soon as they are available. The fastest way to get your results is to activate your My Chart account. Instructions are located on the last page of this paperwork. If you have not heard from us regarding the results in 2 weeks, please contact this office.      

## 2015-07-15 NOTE — Progress Notes (Signed)
Please see previous note  Patient took her medicine in her neck now has good range of motion and she has no subsequent symptoms such as radiation of pain or neck pain.   Objective:  no acute distress BP 128/92 mmHg  Pulse 65  Temp(Src) 97.6 F (36.4 C) (Oral)  Resp 16  Ht 5\' 5"  (1.651 m)  Wt 269 lb (122.018 kg)  BMI 44.76 kg/m2  SpO2 99%  LMP 09/08/2010   Neck range of motion is normal , with normal inspection and palpation   arm range of motion is normal   Assessment: Resolved neck strain  Elvina SidleKurt Zein Helbing, Md

## 2015-10-09 ENCOUNTER — Other Ambulatory Visit (INDEPENDENT_AMBULATORY_CARE_PROVIDER_SITE_OTHER): Payer: BLUE CROSS/BLUE SHIELD

## 2015-10-09 DIAGNOSIS — Z0000 Encounter for general adult medical examination without abnormal findings: Secondary | ICD-10-CM

## 2015-10-09 DIAGNOSIS — Z0189 Encounter for other specified special examinations: Secondary | ICD-10-CM

## 2015-10-09 DIAGNOSIS — R739 Hyperglycemia, unspecified: Secondary | ICD-10-CM | POA: Diagnosis not present

## 2015-10-09 LAB — CBC WITH DIFFERENTIAL/PLATELET
BASOS ABS: 0 10*3/uL (ref 0.0–0.1)
Basophils Relative: 0.5 % (ref 0.0–3.0)
EOS PCT: 4.1 % (ref 0.0–5.0)
Eosinophils Absolute: 0.2 10*3/uL (ref 0.0–0.7)
HCT: 39 % (ref 36.0–46.0)
HEMOGLOBIN: 13 g/dL (ref 12.0–15.0)
Lymphocytes Relative: 42.8 % (ref 12.0–46.0)
Lymphs Abs: 2.3 10*3/uL (ref 0.7–4.0)
MCHC: 33.3 g/dL (ref 30.0–36.0)
MCV: 87.2 fl (ref 78.0–100.0)
MONO ABS: 0.3 10*3/uL (ref 0.1–1.0)
MONOS PCT: 5.9 % (ref 3.0–12.0)
Neutro Abs: 2.6 10*3/uL (ref 1.4–7.7)
Neutrophils Relative %: 46.7 % (ref 43.0–77.0)
Platelets: 307 10*3/uL (ref 150.0–400.0)
RBC: 4.47 Mil/uL (ref 3.87–5.11)
RDW: 14.2 % (ref 11.5–15.5)
WBC: 5.5 10*3/uL (ref 4.0–10.5)

## 2015-10-09 LAB — HEPATIC FUNCTION PANEL
ALT: 15 U/L (ref 0–35)
AST: 20 U/L (ref 0–37)
Albumin: 4.1 g/dL (ref 3.5–5.2)
Alkaline Phosphatase: 72 U/L (ref 39–117)
Bilirubin, Direct: 0 mg/dL (ref 0.0–0.3)
Total Bilirubin: 0.2 mg/dL (ref 0.2–1.2)
Total Protein: 7.5 g/dL (ref 6.0–8.3)

## 2015-10-09 LAB — LIPID PANEL
CHOL/HDL RATIO: 4
Cholesterol: 202 mg/dL — ABNORMAL HIGH (ref 0–200)
HDL: 51.5 mg/dL (ref >39.00)
LDL CALC: 139 mg/dL — AB (ref 0–99)
NONHDL: 150.5
Triglycerides: 56 mg/dL (ref 0.0–149.0)
VLDL: 11.2 mg/dL (ref 0.0–40.0)

## 2015-10-09 LAB — BASIC METABOLIC PANEL
BUN: 14 mg/dL (ref 6–23)
CALCIUM: 9.2 mg/dL (ref 8.4–10.5)
CO2: 26 mEq/L (ref 19–32)
CREATININE: 0.74 mg/dL (ref 0.40–1.20)
Chloride: 106 mEq/L (ref 96–112)
GFR: 104.01 mL/min (ref >60.00)
Glucose, Bld: 85 mg/dL (ref 70–99)
Potassium: 3.8 mEq/L (ref 3.5–5.1)
Sodium: 137 mEq/L (ref 135–145)

## 2015-10-09 LAB — URINALYSIS, ROUTINE W REFLEX MICROSCOPIC
Bilirubin Urine: NEGATIVE
Hgb urine dipstick: NEGATIVE
Ketones, ur: NEGATIVE
LEUKOCYTES UA: NEGATIVE
Nitrite: NEGATIVE
PH: 7 (ref 5.0–8.0)
RBC / HPF: NONE SEEN (ref 0–?)
Specific Gravity, Urine: 1.01 (ref 1.000–1.030)
TOTAL PROTEIN, URINE-UPE24: NEGATIVE
Urine Glucose: NEGATIVE
Urobilinogen, UA: 0.2 (ref 0.0–1.0)

## 2015-10-09 LAB — HEMOGLOBIN A1C: HEMOGLOBIN A1C: 5.8 % (ref 4.6–6.5)

## 2015-10-09 LAB — TSH: TSH: 1.24 u[IU]/mL (ref 0.35–4.50)

## 2015-10-11 ENCOUNTER — Encounter: Payer: Self-pay | Admitting: Endocrinology

## 2015-10-11 ENCOUNTER — Ambulatory Visit (INDEPENDENT_AMBULATORY_CARE_PROVIDER_SITE_OTHER): Payer: BLUE CROSS/BLUE SHIELD | Admitting: Endocrinology

## 2015-10-11 VITALS — BP 130/78 | HR 81 | Temp 98.1°F | Ht 65.0 in | Wt 263.0 lb

## 2015-10-11 DIAGNOSIS — Z0000 Encounter for general adult medical examination without abnormal findings: Secondary | ICD-10-CM

## 2015-10-11 NOTE — Progress Notes (Signed)
Subjective:    Patient ID: Ashley Duncan, female    DOB: [birth year], 55 y.o.   MRN: [id number]  HPI Pt is here for regular wellness examination, and is feeling pretty well in general, and says chronic med probs are stable, except as noted below. Past Medical History  Diagnosis Date  . HYPERCHOLESTEROLEMIA 12/08/2007  . ANEMIA, IRON DEFICIENCY 01/23/2009  . ANXIETY 02/21/2007  . DEPRESSION 02/21/2007  . HYPERTENSION 01/29/2010  . PULMONARY NODULE, RIGHT UPPER LOBE 04/05/2008  . OSTEOARTHRITIS 02/21/2007    C Spine, Bilateral knees, heel spurs, right  osteophyte on CT 10/2007  . SWELLING MASS OR LUMP IN HEAD AND NECK 10/01/2007    Past Surgical History  Procedure Laterality Date  . Tubal ligation      Social History   Social History  . Marital Status: Married    Spouse Name: N/A  . Number of Children: 3  . Years of Education: N/A   Occupational History  . Collections Specialist     [employer]   Social History Main Topics  . Smoking status: Never Smoker   . Smokeless tobacco: Not on file  . Alcohol Use: Yes     Comment: rare  . Drug Use: Not on file  . Sexual Activity: Not on file   Other Topics Concern  . Not on file   Social History Narrative   3 children, 1 died premature    No current outpatient prescriptions on file prior to visit.   No current facility-administered medications on file prior to visit.    No Known Allergies  Family History  Problem Relation Age of Onset  . Diabetes Mother   . Cancer Sister     Breast Cancer  . Diabetes Sister   . Diabetes Brother   . Diabetes Sister     BP 130/78 mmHg  Pulse 81  Temp(Src) 98.1 F (36.7 C) (Oral)  Ht  (1.651 m)  Wt 263 lb (119.296 kg)  BMI 43.77 kg/m2  SpO2 98%  LMP 09/08/2010  Review of Systems  Constitutional: Negative for fever.  HENT: Negative for hearing loss.   Eyes: Negative for visual disturbance.  Respiratory: Negative for shortness of breath.   Cardiovascular: Negative for  chest pain.  Gastrointestinal: Negative for blood in stool.  Endocrine: Negative for cold intolerance.  Genitourinary: Negative for hematuria.  Musculoskeletal: Positive for back pain.  Skin: Negative for rash.  Allergic/Immunologic: Negative for environmental allergies.  Neurological: Negative for syncope.  Hematological: Does not bruise/bleed easily.  Psychiatric/Behavioral: Negative for dysphoric mood.       Objective:   Physical Exam VS: see vs page GEN: no distress HEAD: head: no deformity eyes: no periorbital swelling, no proptosis external nose and ears are normal mouth: no lesion seen NECK: supple, thyroid is not enlarged CHEST WALL: no deformity LUNGS:  Clear to auscultation. BREASTS: sees gyn.  CV: reg rate and rhythm, no murmur ABD: abdomen is soft, nontender.  no hepatosplenomegaly.  not distended.  no hernia GENITALIA/RECTAL: sees gyn MUSCULOSKELETAL: muscle bulk and strength are grossly normal.  no obvious joint swelling.  gait is normal and steady EXTEMITIES: no deformity.  no ulcer on the feet.  feet are of normal color and temp.  Trace bilat leg edema.  There is bilateral onychomycosis of the toenails.  Old healed surgical scar on the right foot (bunionectomy).  PULSES: dorsalis pedis intact bilat.  no carotid bruit NEURO:  cn 2-12 grossly intact.   readily moves all 4's.  sensation is intact to touch on the feet SKIN:  Normal texture and temperature.  No rash or suspicious lesion is visible.   NODES:  None palpable at the neck. PSYCH: alert, well-oriented.  Does not appear anxious nor depressed.    ECG is declined     Assessment & Plan:  Wellness visit today, with problems stable, except as noted.  Patient is advised the following: Patient Instructions  please consider these measures for your health:  minimize alcohol.  do not use tobacco products.  have a colonoscopy at least every 10 years from age 57.  Women should have an annual mammogram from age  57.  keep firearms safely stored.  always use seat belts.  have working smoke alarms in your home.  see an eye doctor and dentist regularly.  never drive under the influence of alcohol or drugs (including prescription drugs).   it is critically important to prevent falling down (keep floor areas well-lit, dry, and free of loose objects.  If you have a cane, [name], or wheelchair, you should use it, even for short trips around the house.  Wear flat-soled shoes.  Also, try not to rush) Please consider having weight loss surgery.  It is good for your health.  Here is some information about it.  If you decide to consider further, please call the phone number in the papers, and register for a free informational meeting.  Please return in 1 year.    Romero BellingELLISON, Paulita Licklider, MD

## 2015-10-11 NOTE — Progress Notes (Signed)
Pre visit review using our clinic review tool, if applicable. No additional management support is needed unless otherwise documented below in the visit note. 

## 2015-10-11 NOTE — Patient Instructions (Addendum)
please consider these measures for your health:  minimize alcohol.  do not use tobacco products.  have a colonoscopy at least every 10 years from age 57.  Women should have an annual mammogram from age 57.  keep firearms safely stored.  always use seat belts.  have working smoke alarms in your home.  see an eye doctor and dentist regularly.  never drive under the influence of alcohol or drugs (including prescription drugs).   it is critically important to prevent falling down (keep floor areas well-lit, dry, and free of loose objects.  If you have a cane, Cienfuegos, or wheelchair, you should use it, even for short trips around the house.  Wear flat-soled shoes.  Also, try not to rush) Please consider having weight loss surgery.  It is good for your health.  Here is some information about it.  If you decide to consider further, please call the phone number in the papers, and register for a free informational meeting.  Please return in 1 year.

## 2015-10-11 NOTE — Progress Notes (Signed)
we discussed code status.  pt requests full code, but would not want to be started or maintained on artificial life-support measures if there was not a reasonable chance of recovery 

## 2016-03-30 ENCOUNTER — Encounter: Payer: Self-pay | Admitting: Endocrinology

## 2016-06-04 ENCOUNTER — Encounter: Payer: Self-pay | Admitting: Endocrinology

## 2016-06-05 ENCOUNTER — Ambulatory Visit (INDEPENDENT_AMBULATORY_CARE_PROVIDER_SITE_OTHER): Payer: BLUE CROSS/BLUE SHIELD | Admitting: Endocrinology

## 2016-06-05 ENCOUNTER — Encounter: Payer: Self-pay | Admitting: Endocrinology

## 2016-06-05 DIAGNOSIS — J069 Acute upper respiratory infection, unspecified: Secondary | ICD-10-CM

## 2016-06-05 MED ORDER — FLUTICASONE-SALMETEROL 100-50 MCG/DOSE IN AEPB: 1.0000 | INHALATION_SPRAY | Freq: Two times a day (BID) | RESPIRATORY_TRACT | 3 refills | Status: DC

## 2016-06-05 MED ORDER — AZITHROMYCIN 500 MG PO TABS: 500.0000 mg | ORAL_TABLET | Freq: Every day | ORAL | 0 refills | Status: DC

## 2016-06-05 NOTE — Progress Notes (Signed)
   Subjective:    Patient ID: Ashley MiyamotoMattie Bustamante, female    DOB: 1961-02-21, 56 y.o.   MRN: [id number]  HPI Pt states few days of slight pain at the bilat maxillary areas, and assoc nasal congestion.  She has slight prod cough and wheezing.   Past Medical History:  Diagnosis Date  . ANEMIA, IRON DEFICIENCY 01/23/2009  . ANXIETY 02/21/2007  . DEPRESSION 02/21/2007  . HYPERCHOLESTEROLEMIA 12/08/2007  . HYPERTENSION 01/29/2010  . OSTEOARTHRITIS 02/21/2007   C Spine, Bilateral knees, heel spurs, right [place] osteophyte on CT 10/2007  . PULMONARY NODULE, RIGHT UPPER LOBE 04/05/2008  . SWELLING MASS OR LUMP IN HEAD AND NECK 10/01/2007    Past Surgical History:  Procedure Laterality Date  . TUBAL LIGATION      Social History   Social History  . Marital status: Married    Spouse name: N/A  . Number of children: 3  . Years of education: N/A   Occupational History  . 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 Smoking status: Never Smoker  . Smokeless tobacco: Never Used  . Alcohol use Yes     Comment: rare  . Drug use: Unknown  . Sexual activity: Not on file   Other Topics Concern  . Not on file   Social History Narrative   3 children, 1 died premature    No current outpatient prescriptions on file prior to visit.   No current facility-administered medications on file prior to visit.     No Known Allergies  Family History  Problem Relation Age of Onset  . Diabetes Mother   . Cancer Sister     Breast Cancer  . Diabetes Sister   . Diabetes Brother   . Diabetes Sister     BP (!) 144/86   Pulse 68   Temp 98.3 F (36.8 C) (Oral)   Ht 5\' 5"  (1.651 m)   Wt 256 lb (116.1 kg)   LMP 09/08/2010   SpO2 97%   BMI 42.60 kg/m   Review of Systems Denies fever and sob.     Objective:   Physical Exam VITAL SIGNS:  See vs page GENERAL: no distress head: no deformity  eyes: no periorbital swelling, no proptosis  external nose and ears  are normal  mouth: no lesion seen Both tm's are red.   LUNGS:  Clear to auscultation.       Assessment & Plan:  URI: new HTN: prob situational.   Patient is advised the following: Patient Instructions  I have sent 2 prescription to your pharmacy: antibiotic and inhaler.  Loratadine-d (non-prescription) will help your congestion.   I hope you feel better soon.  If you don't feel better by next week, please call back.  Please call sooner if you get worse.

## 2016-06-05 NOTE — Patient Instructions (Signed)
I have sent 2 prescription to your pharmacy: antibiotic and inhaler.  Loratadine-d (non-prescription) will help your congestion.   I hope you feel better soon.  If you don't feel better by next week, please call back.  Please call sooner if you get worse.

## 2016-06-06 DIAGNOSIS — J069 Acute upper respiratory infection, unspecified: Secondary | ICD-10-CM | POA: Insufficient documentation

## 2016-07-26 ENCOUNTER — Encounter: Payer: Self-pay | Admitting: Endocrinology

## 2016-07-26 ENCOUNTER — Ambulatory Visit (INDEPENDENT_AMBULATORY_CARE_PROVIDER_SITE_OTHER): Payer: BLUE CROSS/BLUE SHIELD | Admitting: Endocrinology

## 2016-07-26 ENCOUNTER — Telehealth: Payer: Self-pay | Admitting: Endocrinology

## 2016-07-26 DIAGNOSIS — M545 Low back pain, unspecified: Secondary | ICD-10-CM

## 2016-07-26 MED ORDER — CYCLOBENZAPRINE HCL 10 MG PO TABS: 10.0000 mg | ORAL_TABLET | Freq: Three times a day (TID) | ORAL | 0 refills | Status: DC | PRN

## 2016-07-26 NOTE — Telephone Encounter (Signed)
Pt has pulled a muscle in her back and it is very painful she wants to be seen today please advise

## 2016-07-26 NOTE — Telephone Encounter (Signed)
4 PM

## 2016-07-26 NOTE — Progress Notes (Signed)
   Subjective:    Patient ID: Abigail MiyamotoMattie Hern, female    DOB: August 30, 1960, 56 y.o.   MRN: [id number]  HPI Pt states few weeks of moderate pain at the left lumbar area, but no assoc bowel retention.  This started with lifting.    Past Medical History:  Diagnosis Date  . ANEMIA, IRON DEFICIENCY 01/23/2009  . ANXIETY 02/21/2007  . DEPRESSION 02/21/2007  . HYPERCHOLESTEROLEMIA 12/08/2007  . HYPERTENSION 01/29/2010  . OSTEOARTHRITIS 02/21/2007   C Spine, Bilateral knees, heel spurs, right [place] osteophyte on CT 10/2007  . PULMONARY NODULE, RIGHT UPPER LOBE 04/05/2008  . SWELLING MASS OR LUMP IN HEAD AND NECK 10/01/2007    Past Surgical History:  Procedure Laterality Date  . TUBAL LIGATION      Social History   Social History  . Marital status: Married    Spouse name: N/A  . Number of children: 3  . Years of education: N/A   Occupational History  . 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 Smoking status: Never Smoker  . Smokeless tobacco: Never Used  . Alcohol use Yes     Comment: rare  . Drug use: Unknown  . Sexual activity: Not on file   Other Topics Concern  . Not on file   Social History Narrative   3 children, 1 died premature    Current Outpatient Prescriptions on File Prior to Visit  Medication Sig Dispense Refill  . Fluticasone-Salmeterol (ADVAIR) 100-50 MCG/DOSE AEPB Inhale 1 puff into the lungs 2 (two) times daily. 1 each 3   No current facility-administered medications on file prior to visit.     No Known Allergies  Family History  Problem Relation Age of Onset  . Diabetes Mother   . Cancer Sister     Breast Cancer  . Diabetes Sister   . Diabetes Brother   . Diabetes Sister     BP 132/70   Pulse 77   Ht 5\' 5"  (1.651 m)   Wt 255 lb (115.7 kg)   LMP 09/08/2010   SpO2 95%   BMI 42.43 kg/m    Review of Systems Denies bladder retention.     Objective:   Physical Exam VITAL SIGNS:  See vs page.  GENERAL:  no distress.  Spine: nontender.  Gait: normal and steady.        Assessment & Plan:  Back pain, new, uncertain etiology.    Patient is advised the following: Patient Instructions  Please see a specialist for your pain.  you will receive a phone call, about a day and time for an appointment.  I have sent a prescription to your pharmacy, for the pain.  Taking tylenol along with this helps more.

## 2016-07-26 NOTE — Telephone Encounter (Signed)
I contacted the patient and advised of message. Requested a call back from the patient to schedule this appointment.

## 2016-07-26 NOTE — Telephone Encounter (Signed)
See message and please advise on a time for the patient to come in and be seen. Thanks!

## 2016-07-26 NOTE — Patient Instructions (Addendum)
Please see a specialist for your pain.  you will receive a phone call, about a day and time for an appointment.  I have sent a prescription to your pharmacy, for the pain.  Taking tylenol along with this helps more.

## 2016-08-14 ENCOUNTER — Encounter: Payer: Self-pay | Admitting: Family Medicine

## 2016-08-14 ENCOUNTER — Ambulatory Visit (INDEPENDENT_AMBULATORY_CARE_PROVIDER_SITE_OTHER): Payer: BLUE CROSS/BLUE SHIELD | Admitting: Family Medicine

## 2016-08-14 ENCOUNTER — Ambulatory Visit
Admission: RE | Admit: 2016-08-14 | Discharge: 2016-08-14 | Disposition: A | Discharge status: Home / Self Care | Payer: BLUE CROSS/BLUE SHIELD | Source: Ambulatory Visit | Attending: Sports Medicine | Admitting: Sports Medicine

## 2016-08-14 ENCOUNTER — Other Ambulatory Visit: Payer: Self-pay | Admitting: Family Medicine

## 2016-08-14 VITALS — BP 150/95 | HR 90 | Ht 65.0 in | Wt 255.0 lb

## 2016-08-14 DIAGNOSIS — M545 Low back pain, unspecified: Secondary | ICD-10-CM

## 2016-08-14 DIAGNOSIS — M25562 Pain in left knee: Secondary | ICD-10-CM | POA: Diagnosis not present

## 2016-08-14 DIAGNOSIS — M25561 Pain in right knee: Secondary | ICD-10-CM

## 2016-08-14 MED ORDER — METHOCARBAMOL 500 MG PO TABS: 500.0000 mg | ORAL_TABLET | Freq: Four times a day (QID) | ORAL | 1 refills | Status: DC

## 2016-08-14 NOTE — Assessment & Plan Note (Signed)
The pain is most likely related to any form of facet arthritis and does not have any radicular symptoms to suggest a disc. - Flexion and extension, AP, lateral lumbar spine x-rays - Robaxin - Physical therapy referral placed

## 2016-08-14 NOTE — Progress Notes (Signed)
  Ashley Duncan - 56 y.o. female MRN [id number]  Date of birth: 02/13/1961  SUBJECTIVE:  Including CC & ROS.   Ashley Duncan is a 56 yo F that is presenting with left sided lower back pain and bilateral knee pain. The lower back pain is acute on chronic in nature. She had pain 5 years ago and went to a chiropracter for a year. She denies any radicular symptoms. She isn't taking any medications. The pain started gradual in nature. The pain is worse in the morning and after riding in the car for a period of time.   She also reports that she has bilateral knee pain. The pain on the right knee is worse in the left knee. The pain is occurring in the medial aspect bilaterally. The pain is worse with getting up from a seated position. She denies taking any medications. She has not had any injection therapy or injuries. She's not had any prior surgery on her knees. The pain seems to be the same. She denies any swelling.  ROS: No unexpected weight loss, fever, chills, swelling, instability, numbness/tingling, redness, otherwise see HPI   HISTORY: Past Medical, Surgical, Social, and Family History Reviewed & Updated per EMR.   Pertinent Historical Findings include: PMSHx -  hypertension, hypercholesterolemia, PSHx -  No tobacco use, occasional alcohol use  FHx -  Cancer, arthritis, HTN, DM2    DATA REVIEWED: 10/10/2014: right foot x-ray: Tricompartmental degenerative changes, severe in the medial and patellofemoral compartments.  No acute abnormalities.  PHYSICAL EXAM:  VS: BP:(!) 150/95  HR:90bpm  TEMP: ( )  RESP:   HT:5\' 5"  (165.1 cm)   WT:255 lb (115.7 kg)  BMI:42.5 PHYSICAL EXAM: Gen: NAD, alert, cooperative with exam, well-appearing HEENT: clear conjunctiva, EOMI CV:  no edema, capillary refill brisk,  Resp: non-labored, normal speech Skin: no rashes, normal turgor  Neuro: no gross deficits.  Psych:  alert and oriented MSK:  Back Exam:  Inspection: Unremarkable  Palpable tenderness:  Tender to palpation of the paraspinal muscles on the left. No tenderness to palpation of the greater trochanter, SI joint, piriformis or lumbar spine. Strength at foot: Plantar-flexion: 5/5 Dorsi-flexion: 5/5 Eversion: 5/5 Inversion: 5/5  Sensory change: Gross sensation intact to all lumbar and sacral dermatomes.  Gait unremarkable. SLR laying: Negative  XSLR laying: Negative  FABER: negative. Knee: Normal to inspection with no erythema or effusion or obvious bony abnormalities. Palpation normal with no warmth, patellar tenderness, or condyle tenderness. Some pain to outpatient over the medial joint line. ROM full in flexion and extension and lower leg rotation. Ligaments with solid consistent endpoints including  LCL, MCL. Negative Mcmurray's Non painful patellar compression. Patellar glide without crepitus. Patellar and quadriceps tendons unremarkable. Hamstring and quadriceps strength is normal.     ASSESSMENT & PLAN:   Low back pain The pain is most likely related to any form of facet arthritis and does not have any radicular symptoms to suggest a disc. - Flexion and extension, AP, lateral lumbar spine x-rays - Robaxin - Physical therapy referral placed    Acute pain of both knees She has significant medial compartment arthritis in the right knee. Most likely is also occurring in the left knee. - X-rays of the knees - Provided home exercises - Can consider injection therapy in the future

## 2016-08-14 NOTE — Assessment & Plan Note (Signed)
She has significant medial compartment arthritis in the right knee. Most likely is also occurring in the left knee. - X-rays of the knees - Provided home exercises - Can consider injection therapy in the future

## 2016-08-15 ENCOUNTER — Telehealth: Payer: Self-pay | Admitting: Family Medicine

## 2016-08-15 NOTE — Telephone Encounter (Signed)
Spoke with patient about her results. She will continue PT for her back. She will try a knee unloader brace for her knees.   Myra Rude, MD PGY-4, [facility] 08/15/2016, 1:57 PM

## 2016-09-11 ENCOUNTER — Ambulatory Visit (INDEPENDENT_AMBULATORY_CARE_PROVIDER_SITE_OTHER): Payer: BLUE CROSS/BLUE SHIELD | Admitting: Family Medicine

## 2016-09-11 ENCOUNTER — Encounter: Payer: Self-pay | Admitting: Family Medicine

## 2016-09-11 DIAGNOSIS — M25561 Pain in right knee: Secondary | ICD-10-CM

## 2016-09-11 DIAGNOSIS — M545 Low back pain, unspecified: Secondary | ICD-10-CM

## 2016-09-11 DIAGNOSIS — M25562 Pain in left knee: Secondary | ICD-10-CM | POA: Diagnosis not present

## 2016-09-11 NOTE — Assessment & Plan Note (Addendum)
Has significant improvement with her braces. She would likely qualify for knee replacement but if she is doing well with the braces then go as long as she can before that has to be the option.   - provided HEP  - can always consider injection therapy in the future  - f/u PRN

## 2016-09-11 NOTE — Progress Notes (Signed)
  Ashley MiyamotoMattie Duncan - 56 y.o. female MRN [id number]  Date of birth: 1961/02/18  SUBJECTIVE:  Including CC & ROS.   Ashley Duncan is a 56 yo F that is following for her bilateral knee pain and lower back pain. She has been fitted for medial unloader braces and reports significant improvement in her knee pain. She feels like she is able to walk normal now. She is happy with the results. She also reports some improvement in her back pain. Is still taking the muscle relaxer but only taking two per day. 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 She hasn't been to PT.   ROS: No unexpected weight loss, fever, chills, swelling, instability, muscle pain, numbness/tingling, redness, otherwise see HPI   HISTORY: Past Medical, Surgical, Social, and Family History Reviewed & Updated per EMR.   Pertinent Historical Findings include: PMHx - HTN, HLD  DATA REVIEWED: 08/14/16: left knee x-ray: Tricompartmental degenerative joint disease of the right knee with complete loss of joint space medially. 08/14/16: right knee x-ray: Little change in tricompartmental degenerative joint disease of the right knee with severe involvement of the medial compartment. 08/14/16: lumbar spine xray: 1. Advanced facet arthropathy and dynamic anterolisthesis at L4-5, exacerbated with flexion. 2. Chronic loss of disc height and probable foraminal narrowing bilaterally at L5-S1. 3. Right lateral and posterior endplate changes at L1-2 and L2-3.  PHYSICAL EXAM:  VS: BP (!) 165/91   Ht 5\' 5"  (1.651 m)   Wt 255 lb (115.7 kg)   LMP 09/08/2010   BMI 42.43 kg/m  PHYSICAL EXAM: Gen: NAD, alert, cooperative with exam, well-appearing HEENT: clear conjunctiva, EOMI CV:  no edema, capillary refill brisk,  Resp: non-labored, normal speech Skin: no rashes, normal turgor  Neuro: no gross deficits.  Psych:  alert and oriented MSK:  Back:  Some TTP of the left paraspinal muscles.  No TTP of the SI joint, GT, piriformis or  lumbar spine  Normal IR and ER Knee:  No obvious effusion.  Normal ROM  Normal strength  Neurovascularly intact   ASSESSMENT & PLAN:   Low back pain She reports improvement in her pain. She attributes some of the improvement to her knee braces but also the muscle relaxer is helping.  - continue the muscle relaxer.  - provided HEP  - f/u if no improvement. Could consider referral to PM&R for the possibility of injection therapy.   Acute pain of both knees Has significant improvement with her braces. She would likely qualify for knee replacement but if she is doing well with the braces then go as long as she can before that has to be the option.   - provided HEP  - can always consider injection therapy in the future  - f/u PRN

## 2016-09-11 NOTE — Assessment & Plan Note (Signed)
She reports improvement in her pain. She attributes some of the improvement to her knee braces but also the muscle relaxer is helping.  - continue the muscle relaxer.  - provided HEP  - f/u if no improvement. Could consider referral to PM&R for the possibility of injection therapy.

## 2016-10-10 ENCOUNTER — Encounter: Payer: Self-pay | Admitting: Endocrinology

## 2016-10-10 ENCOUNTER — Ambulatory Visit (INDEPENDENT_AMBULATORY_CARE_PROVIDER_SITE_OTHER): Payer: BLUE CROSS/BLUE SHIELD | Admitting: Endocrinology

## 2016-10-10 VITALS — BP 126/78 | HR 83 | Ht 65.0 in | Wt 251.0 lb

## 2016-10-10 DIAGNOSIS — Z0000 Encounter for general adult medical examination without abnormal findings: Secondary | ICD-10-CM

## 2016-10-10 DIAGNOSIS — R739 Hyperglycemia, unspecified: Secondary | ICD-10-CM | POA: Diagnosis not present

## 2016-10-10 LAB — HEPATIC FUNCTION PANEL
ALBUMIN: 4.1 g/dL (ref 3.5–5.2)
ALK PHOS: 75 U/L (ref 39–117)
ALT: 17 U/L (ref 0–35)
AST: 25 U/L (ref 0–37)
BILIRUBIN DIRECT: 0.1 mg/dL (ref 0.0–0.3)
Total Bilirubin: 0.3 mg/dL (ref 0.2–1.2)
Total Protein: 7.3 g/dL (ref 6.0–8.3)

## 2016-10-10 LAB — CBC WITH DIFFERENTIAL/PLATELET
BASOS PCT: 0.7 % (ref 0.0–3.0)
Basophils Absolute: 0 10*3/uL (ref 0.0–0.1)
Eosinophils Absolute: 0.3 10*3/uL (ref 0.0–0.7)
Eosinophils Relative: 4.5 % (ref 0.0–5.0)
HCT: 38.1 % (ref 36.0–46.0)
Hemoglobin: 12.5 g/dL (ref 12.0–15.0)
LYMPHS ABS: 2.5 10*3/uL (ref 0.7–4.0)
Lymphocytes Relative: 39.1 % (ref 12.0–46.0)
MCHC: 32.8 g/dL (ref 30.0–36.0)
MCV: 89.1 fl (ref 78.0–100.0)
MONO ABS: 0.4 10*3/uL (ref 0.1–1.0)
MONOS PCT: 6.9 % (ref 3.0–12.0)
NEUTROS ABS: 3.1 10*3/uL (ref 1.4–7.7)
Neutrophils Relative %: 48.8 % (ref 43.0–77.0)
PLATELETS: 280 10*3/uL (ref 150.0–400.0)
RBC: 4.27 Mil/uL (ref 3.87–5.11)
RDW: 14.4 % (ref 11.5–15.5)
WBC: 6.4 10*3/uL (ref 4.0–10.5)

## 2016-10-10 LAB — URINALYSIS, ROUTINE W REFLEX MICROSCOPIC
BILIRUBIN URINE: NEGATIVE
Hgb urine dipstick: NEGATIVE
Ketones, ur: NEGATIVE
Leukocytes, UA: NEGATIVE
NITRITE: NEGATIVE
Specific Gravity, Urine: >=1.03 — AB (ref 1.000–1.030)
Total Protein, Urine: NEGATIVE
Urine Glucose: NEGATIVE
Urobilinogen, UA: 0.2 (ref 0.0–1.0)
pH: 6 (ref 5.0–8.0)

## 2016-10-10 LAB — BASIC METABOLIC PANEL
BUN: 16 mg/dL (ref 6–23)
CALCIUM: 9.5 mg/dL (ref 8.4–10.5)
CHLORIDE: 108 meq/L (ref 96–112)
CO2: 27 meq/L (ref 19–32)
CREATININE: 0.82 mg/dL (ref 0.40–1.20)
GFR: 92.06 mL/min (ref >60.00)
Glucose, Bld: 78 mg/dL (ref 70–99)
Potassium: 3.6 mEq/L (ref 3.5–5.1)
Sodium: 143 mEq/L (ref 135–145)

## 2016-10-10 LAB — HEMOGLOBIN A1C: HEMOGLOBIN A1C: 5.8 % (ref 4.6–6.5)

## 2016-10-10 LAB — LIPID PANEL
Cholesterol: 196 mg/dL (ref 0–200)
HDL: 59.5 mg/dL (ref >39.00)
LDL Cholesterol: 124 mg/dL — ABNORMAL HIGH (ref 0–99)
NonHDL: 136.93
TRIGLYCERIDES: 66 mg/dL (ref 0.0–149.0)
Total CHOL/HDL Ratio: 3
VLDL: 13.2 mg/dL (ref 0.0–40.0)

## 2016-10-10 LAB — TSH: TSH: 1.67 u[IU]/mL (ref 0.35–4.50)

## 2016-10-10 LAB — HEPATITIS C ANTIBODY: HCV Ab: NEGATIVE

## 2016-10-10 NOTE — Progress Notes (Signed)
Subjective:    Patient ID: Ashley MiyamotoMattie Harkey, female    DOB: Jun 29, 1960, 56 y.o.   MRN: [id number]  HPI Pt is here for regular wellness examination, and is feeling pretty well in general, and says chronic med probs are stable, except as noted below Past Medical History:  Diagnosis Date  . ANEMIA, IRON DEFICIENCY 01/23/2009  . ANXIETY 02/21/2007  . DEPRESSION 02/21/2007  . HYPERCHOLESTEROLEMIA 12/08/2007  . HYPERTENSION 01/29/2010  . OSTEOARTHRITIS 02/21/2007   C Spine, Bilateral knees, heel spurs, right [place] osteophyte on CT 10/2007  . PULMONARY NODULE, RIGHT UPPER LOBE 04/05/2008  . SWELLING MASS OR LUMP IN HEAD AND NECK 10/01/2007    Past Surgical History:  Procedure Laterality Date  . TUBAL LIGATION      Social History   Social History  . Marital status: Married    Spouse name: N/A  . Number of children: 3  . Years of education: N/A   Occupational History  . 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 Smoking status: Never Smoker  . Smokeless tobacco: Never Used  . Alcohol use Yes     Comment: rare  . Drug use: Unknown  . Sexual activity: Not on file   Other Topics Concern  . Not on file   Social History Narrative   3 children, 1 died premature    Current Outpatient Prescriptions on File Prior to Visit  Medication Sig Dispense Refill  . acetaminophen (TYLENOL) 325 MG tablet Take 650 mg by mouth every 6 (six) hours as needed.    . Fluticasone-Salmeterol (ADVAIR) 100-50 MCG/DOSE AEPB Inhale 1 puff into the lungs 2 (two) times daily. 1 each 3  . methocarbamol (ROBAXIN) 500 MG tablet Take 1 tablet (500 mg total) by mouth 4 (four) times daily. 60 tablet 1   No current facility-administered medications on file prior to visit.     No Known Allergies  Family History  Problem Relation Age of Onset  . Diabetes Mother   . Cancer Sister        Breast Cancer  . Diabetes Sister   . Diabetes Brother   . Diabetes Sister     BP  126/78   Pulse 83   Ht 5\' 5"  (1.651 m)   Wt 251 lb (113.9 kg)   LMP 09/08/2010   SpO2 96%   BMI 41.77 kg/m     Review of Systems Constitutional: Negative for fever.  HENT: Negative for hearing loss.   Eyes: Negative for visual disturbance.  Respiratory: Negative for shortness of breath.   Cardiovascular: Negative for chest pain.  Gastrointestinal: Negative for blood in stool.  Endocrine: Negative for cold intolerance.  Genitourinary: Negative for hematuria.  Musculoskeletal: Positive for back pain.  Skin: Negative for rash.  Allergic/Immunologic: Negative for environmental allergies.  Neurological: Negative for numbness and syncope.  Hematological: Does not bruise/bleed easily.  Psychiatric/Behavioral: Negative for dysphoric mood.     Objective:   Physical Exam VS: see vs page GEN: no distress HEAD: head: no deformity eyes: no periorbital swelling, no proptosis external nose and ears are normal mouth: no lesion seen NECK: supple, thyroid is not enlarged. CHEST WALL: no deformity LUNGS:  Clear to auscultation. BREASTS: sees gyn.  CV: reg rate and rhythm, no murmur ABD: abdomen is soft, nontender.  no hepatosplenomegaly.  not distended.  no hernia GENITALIA/RECTAL: sees gyn  MUSCULOSKELETAL: muscle bulk and strength are grossly normal.  no obvious joint swelling.  gait is normal and steady EXTEMITIES: no deformity.  no ulcer on the feet.  feet are of normal color and temp.  Trace bilat leg edema.  There is bilateral onychomycosis of the toenails.  Old healed surgical scar on the right foot (bunionectomy).  PULSES: dorsalis pedis intact bilat.  no carotid bruit NEURO:  cn 2-12 grossly intact.   readily moves all 4's.  sensation is intact to touch on the feet SKIN:  Normal texture and temperature.  No rash or suspicious lesion is visible.   NODES:  None palpable at the neck. PSYCH: alert, well-oriented.  Does not appear anxious nor depressed.    I personally reviewed  electrocardiogram tracing (today): Indication: wellness Impression: NSR.  No MI.  No hypertrophy. Compared to 2011: no significant change     Assessment & Plan:  Wellness visit today, with problems stable, except as noted.

## 2016-10-10 NOTE — Patient Instructions (Signed)
Please consider these measures for your health:  minimize alcohol.  Do not use tobacco products.  Have a colonoscopy at least every 10 years from age 58.  Women should have an annual mammogram from age 40.  Keep firearms safely stored.  Always use seat belts.  have working smoke alarms in your home.  See an eye doctor and dentist regularly.  Never drive under the influence of alcohol or drugs (including prescription drugs).   blood tests are requested for you today.  We'll let you know about the results. Please return in 1 year.   

## 2016-10-11 LAB — HIV ANTIBODY (ROUTINE TESTING W REFLEX): HIV 1&2 Ab, 4th Generation: NONREACTIVE

## 2017-05-05 ENCOUNTER — Encounter: Payer: Self-pay | Admitting: Endocrinology

## 2017-07-18 ENCOUNTER — Telehealth: Payer: Self-pay | Admitting: Endocrinology

## 2017-07-18 ENCOUNTER — Encounter (HOSPITAL_COMMUNITY): Payer: Self-pay | Admitting: Emergency Medicine

## 2017-07-18 ENCOUNTER — Ambulatory Visit (HOSPITAL_COMMUNITY)
Admission: EM | Admit: 2017-07-18 | Discharge: 2017-07-18 | Disposition: A | Discharge status: Home / Self Care | Payer: BLUE CROSS/BLUE SHIELD | Attending: Family Medicine | Admitting: Family Medicine

## 2017-07-18 DIAGNOSIS — B349 Viral infection, unspecified: Secondary | ICD-10-CM

## 2017-07-18 DIAGNOSIS — H1033 Unspecified acute conjunctivitis, bilateral: Secondary | ICD-10-CM

## 2017-07-18 MED ORDER — CETIRIZINE HCL 10 MG PO TABS: 10.0000 mg | ORAL_TABLET | Freq: Every day | ORAL | 0 refills | Status: DC

## 2017-07-18 MED ORDER — OFLOXACIN 0.3 % OP SOLN: 1.0000 [drp] | Freq: Four times a day (QID) | OPHTHALMIC | 0 refills | Status: AC

## 2017-07-18 MED ORDER — FLUTICASONE PROPIONATE 50 MCG/ACT NA SUSP: 2.0000 | Freq: Every day | NASAL | 0 refills | Status: DC

## 2017-07-18 MED ORDER — POLYETHYL GLYCOL-PROPYL GLYCOL 0.4-0.3 % OP GEL: 1.0000 "application " | Freq: Every evening | OPHTHALMIC | 0 refills | Status: AC | PRN

## 2017-07-18 NOTE — Discharge Instructions (Signed)
Use ofloxacin eyedrops as directed on both eyes. Artificial tear gel at night. Wait 10-15 minutes between drops, always use artificial tear gel last, as it prevents drops from penetrating through. Lid scrubs and warm compresses as directed.  As discussed, symptoms could also be due to viral illness.  Monitor for any worsening of symptoms, changes in vision, sensitivity to light, eye swelling, follow up with ophthalmology for further evaluation.   Start flonase, zyrtec for nasal congestion/drainage. You can use over the counter nasal saline rinse such as neti pot for nasal congestion. Keep hydrated, your urine should be clear to pale yellow in color. Tylenol/motrin for fever and pain. Monitor for any worsening of symptoms, chest pain, shortness of breath, wheezing, swelling of the throat, follow up for reevaluation.

## 2017-07-18 NOTE — ED Triage Notes (Signed)
PT reports scratchy eyes bilaterally with congestion

## 2017-07-18 NOTE — Telephone Encounter (Signed)
error 

## 2017-07-18 NOTE — ED Provider Notes (Signed)
[facility]    CSN: [id number] Arrival date & time: 07/18/17  1214     History   Chief Complaint Chief Complaint  Patient presents with  . Eye Problem    HPI Ashley Duncan is a 57 y.o. female.   57 year old female comes in for 2-day history of URI symptoms with eye irritation.  States she has had mild productive cough, nasal congestion, rhinorrhea, sneezing.  Denies fever, chills, night sweats.  She is in due to bilateral eye irritation with redness and crusting in the morning.  She denies changes in vision, photophobia.  Has foreign body sensation.  Denies contact lens use, wears glasses.  States she is exposed to pinkeye.  Has not taken anything for the symptoms.      Past Medical History:  Diagnosis Date  . ANEMIA, IRON DEFICIENCY 01/23/2009  . ANXIETY 02/21/2007  . DEPRESSION 02/21/2007  . HYPERCHOLESTEROLEMIA 12/08/2007  . HYPERTENSION 01/29/2010  . OSTEOARTHRITIS 02/21/2007   C Spine, Bilateral knees, heel spurs, right [place] osteophyte on CT 10/2007  . PULMONARY NODULE, RIGHT UPPER LOBE 04/05/2008  . SWELLING MASS OR LUMP IN HEAD AND NECK 10/01/2007    Patient Active Problem List   Diagnosis Date Noted  . Low back pain 07/26/2016  . Gallstone 10/10/2014  . Acute pain of both knees 10/10/2014  . Wellness examination 10/06/2014  . Hyperglycemia 07/16/2013  . Myalgia 05/18/2012  . Numbness 05/18/2012  . Routine general medical examination at a health care facility 02/05/2011  . HYPERTENSION 01/29/2010  . COUGH 03/17/2009  . PULMONARY NODULE, RIGHT UPPER LOBE 04/05/2008  . HYPERCHOLESTEROLEMIA 12/08/2007  . FATIGUE 11/30/2007  . SWELLING MASS OR LUMP IN HEAD AND NECK 10/01/2007  . ANXIETY 02/21/2007  . DEPRESSION 02/21/2007  . OSTEOARTHRITIS 02/21/2007    Past Surgical History:  Procedure Laterality Date  . TUBAL LIGATION      OB History    No data available       Home Medications    Prior to Admission medications   Medication Sig Start  Date End Date Taking? Authorizing Provider  acetaminophen (TYLENOL) 325 MG tablet Take 650 mg by mouth every 6 (six) hours as needed.    [provider]  cetirizine (ZYRTEC) 10 MG tablet Take 1 tablet (10 mg total) by mouth daily. 07/18/17   Cathie Hoops, Amy V, PA-C  fluticasone (FLONASE) 50 MCG/ACT nasal spray Place 2 sprays into both nostrils daily. 07/18/17   Cathie Hoops, Amy V, PA-C  Fluticasone-Salmeterol (ADVAIR) 100-50 MCG/DOSE AEPB Inhale 1 puff into the lungs 2 (two) times daily. 06/05/16   Romero Belling, MD  methocarbamol (ROBAXIN) 500 MG tablet Take 1 tablet (500 mg total) by mouth 4 (four) times daily. 08/14/16   Myra Rude, MD  ofloxacin (OCUFLOX) 0.3 % ophthalmic solution Place 1 drop into both eyes 4 (four) times daily for 7 days. 07/18/17 07/25/17  Belinda Fisher, PA-C  Polyethyl Glycol-Propyl Glycol (SYSTANE) 0.4-0.3 % GEL ophthalmic gel Place 1 application into both eyes at bedtime as needed. 07/18/17   Belinda Fisher, PA-C    Family History Family History  Problem Relation Age of Onset  . Diabetes Mother   . Cancer Sister        Breast Cancer  . Diabetes Sister   . Diabetes Brother   . Diabetes Sister     Social History Social History   Tobacco Use  . Smoking status: Never Smoker  . Smokeless tobacco: Never Used  Substance Use Topics  .  Alcohol use: Yes    Comment: rare  . Drug use: Not on file     Allergies   Patient has no known allergies.   Review of Systems Review of Systems  Reason unable to perform ROS: See HPI as above.     Physical Exam Triage Vital Signs ED Triage Vitals  Enc Vitals Group     BP 07/18/17 1339 (!) 150/91     Pulse Rate 07/18/17 1339 72     Resp 07/18/17 1339 16     Temp 07/18/17 1339 98.3 F (36.8 C)     Temp Source 07/18/17 1339 Oral     SpO2 07/18/17 1339 100 %     Weight 07/18/17 1338 265 lb (120.2 kg)     Height --      Head Circumference --      Peak Flow --      Pain Score 07/18/17 1337 2     Pain Loc --      Pain Edu? --       Excl. in GC? --    No data found.  Updated Vital Signs BP (!) 150/91   Pulse 72   Temp 98.3 F (36.8 C) (Oral)   Resp 16   Wt 265 lb (120.2 kg)   LMP 09/08/2010   SpO2 100%   BMI 44.10 kg/m   Visual Acuity Right Eye Distance: 20/30 Left Eye Distance: 20/30 Bilateral Distance: 20/30  Right Eye Near:   Left Eye Near:    Bilateral Near:     Physical Exam  Constitutional: She is oriented to person, place, and time. She appears well-developed and well-nourished. No distress.  HENT:  Head: Normocephalic and atraumatic.  Right Ear: Tympanic membrane, external ear and ear canal normal. Tympanic membrane is not erythematous and not bulging.  Left Ear: Tympanic membrane, external ear and ear canal normal. Tympanic membrane is not erythematous and not bulging.  Nose: Mucosal edema and rhinorrhea present. Right sinus exhibits no maxillary sinus tenderness and no frontal sinus tenderness. Left sinus exhibits no maxillary sinus tenderness and no frontal sinus tenderness.  Mouth/Throat: Uvula is midline, oropharynx is clear and moist and mucous membranes are normal.  Eyes: EOM and lids are normal. Pupils are equal, round, and reactive to light. Lids are everted and swept, no foreign bodies found. No foreign body present in the right eye. No foreign body present in the left eye. Right conjunctiva is not injected. Left conjunctiva is injected.  No ciliary injection.  Neck: Normal range of motion. Neck supple.  Cardiovascular: Normal rate, regular rhythm and normal heart sounds. Exam reveals no gallop and no friction rub.  No murmur heard. Pulmonary/Chest: Effort normal and breath sounds normal. She has no decreased breath sounds. She has no wheezes. She has no rhonchi. She has no rales.  Lymphadenopathy:    She has no cervical adenopathy.  Neurological: She is alert and oriented to person, place, and time.  Skin: Skin is warm and dry.  Psychiatric: She has a normal mood and affect. Her  behavior is normal. Judgment normal.     UC Treatments / Results  Labs (all labs ordered are listed, but only abnormal results are displayed) Labs Reviewed - No data to display  EKG  EKG Interpretation None       Radiology No results found.  Procedures Procedures (including critical care time)  Medications Ordered in UC Medications - No data to display   Initial Impression / Assessment and Plan /  UC Course  I have reviewed the triage vital signs and the nursing notes.  Pertinent labs & imaging results that were available during my care of the patient were reviewed by me and considered in my medical decision making (see chart for details).    Discussed with patient history and exam most consistent with viral URI. Symptomatic treatment as needed. Push fluids. Return precautions given.   Discussed possible viral conjunctivitis causing symptoms.  Patient would like to be treated empirically for bacterial conjunctivitis due to exposure.  Start ofloxacin drops as directed. Artificial tears gel as directed. Lid scrubs and warm compresses as directed. Patient to follow up with ophthalmology if symptoms worsens or does not improve. Return precautions given.  Patient expresses understanding and agrees to plan.  Final Clinical Impressions(s) / UC Diagnoses   Final diagnoses:  Acute conjunctivitis of both eyes, unspecified acute conjunctivitis type  Viral illness    ED Discharge Orders        Ordered    ofloxacin (OCUFLOX) 0.3 % ophthalmic solution  4 times daily     07/18/17 1417    Polyethyl Glycol-Propyl Glycol (SYSTANE) 0.4-0.3 % GEL ophthalmic gel  At bedtime PRN     07/18/17 1417    fluticasone (FLONASE) 50 MCG/ACT nasal spray  Daily     07/18/17 1417    cetirizine (ZYRTEC) 10 MG tablet  Daily     07/18/17 1418        Belinda Fisher, PA-C 07/18/17 1422

## 2017-08-11 ENCOUNTER — Ambulatory Visit: Payer: BLUE CROSS/BLUE SHIELD | Admitting: Endocrinology

## 2017-08-11 ENCOUNTER — Encounter: Payer: Self-pay | Admitting: Endocrinology

## 2017-08-11 ENCOUNTER — Ambulatory Visit
Admission: RE | Admit: 2017-08-11 | Discharge: 2017-08-11 | Disposition: A | Discharge status: Home / Self Care | Payer: BLUE CROSS/BLUE SHIELD | Source: Ambulatory Visit | Attending: Endocrinology | Admitting: Endocrinology

## 2017-08-11 VITALS — BP 152/105 | HR 79 | Wt 261.8 lb

## 2017-08-11 DIAGNOSIS — M79672 Pain in left foot: Secondary | ICD-10-CM

## 2017-08-11 DIAGNOSIS — M79644 Pain in right finger(s): Secondary | ICD-10-CM

## 2017-08-11 DIAGNOSIS — G8929 Other chronic pain: Secondary | ICD-10-CM

## 2017-08-11 DIAGNOSIS — M25561 Pain in right knee: Secondary | ICD-10-CM

## 2017-08-11 DIAGNOSIS — M25511 Pain in right shoulder: Secondary | ICD-10-CM

## 2017-08-11 NOTE — Patient Instructions (Addendum)
X-rays are requested for you today.  We'll let you know about the results. Please see a specialist.  you will receive a phone call, about a day and time for an appointment.

## 2017-08-11 NOTE — Progress Notes (Signed)
Subjective:    Patient ID: Ashley Duncan, female    DOB: [birth year], 57 y.o.   MRN: [id number]  HPI Pt states 1 month of moderate pain at the right ankle/foot.  She has slight assoc swelling.   Past Medical History:  Diagnosis Date  . ANEMIA, IRON DEFICIENCY 01/23/2009  . ANXIETY 02/21/2007  . DEPRESSION 02/21/2007  . HYPERCHOLESTEROLEMIA 12/08/2007  . HYPERTENSION 01/29/2010  . OSTEOARTHRITIS 02/21/2007   C Spine, Bilateral knees, heel spurs, right [place] osteophyte on CT 10/2007  . PULMONARY NODULE, RIGHT UPPER LOBE 04/05/2008  . SWELLING MASS OR LUMP IN HEAD AND NECK 10/01/2007    Past Surgical History:  Procedure Laterality Date  . TUBAL LIGATION      Social History   Socioeconomic History  . Marital status: Married    Spouse name: Not on file  . Number of children: 3  . Years of education: Not on file  . Highest education level: Not on file  Occupational History  . 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 Financial resource strain: Not on file  . Food insecurity:    Worry: Not on file    Inability: Not on file  . Transportation needs:    Medical: Not on file    Non-medical: Not on file  Tobacco Use  . Smoking status: Never Smoker  . Smokeless tobacco: Never Used  Substance and Sexual Activity  . Alcohol use: Yes    Comment: rare  . Drug use: Not on file  . Sexual activity: Not on file  Lifestyle  . Physical activity:    Days per week: Not on file    Minutes per session: Not on file  . Stress: Not on file  Relationships  . Social connections:    Talks on phone: Not on file    Gets together: Not on file    Attends religious service: Not on file    Active member of club or organization: Not on file    Attends meetings of clubs or organizations: Not on file    Relationship status: Not on file  . Intimate partner violence:    Fear of current or ex partner: Not on file    Emotionally abused: Not on  file    Physically abused: Not on file    Forced sexual activity: Not on file  Other Topics Concern  . Not on file  Social History Narrative   3 children, 1 died premature    Current Outpatient Medications on File Prior to Visit  Medication Sig Dispense Refill  . acetaminophen (TYLENOL) 325 MG tablet Take 650 mg by mouth every 6 (six) hours as needed.    . cetirizine (ZYRTEC) 10 MG tablet Take 1 tablet (10 mg total) by mouth daily. 15 tablet 0  . fluticasone (FLONASE) 50 MCG/ACT nasal spray Place 2 sprays into both nostrils daily. 1 g 0  . Fluticasone-Salmeterol (ADVAIR) 100-50 MCG/DOSE AEPB Inhale 1 puff into the lungs 2 (two) times daily. 1 each 3  . methocarbamol (ROBAXIN) 500 MG tablet Take 1 tablet (500 mg total) by mouth 4 (four) times daily. 60 tablet 1  . Polyethyl Glycol-Propyl Glycol (SYSTANE) 0.4-0.3 % GEL ophthalmic gel Place 1 application into both eyes at bedtime as needed. 1 Bottle 0   No current facility-administered medications on file prior to visit.     No Known Allergies  Family History  Problem Relation Age of  Onset  . Diabetes Mother   . Cancer Sister        Breast Cancer  . Diabetes Sister   . Diabetes Brother   . Diabetes Sister     BP (!) 152/105 (BP Location: Left Arm, Patient Position: Sitting, Cuff Size: Large)   Pulse 79   Wt 261 lb 12.8 oz (118.8 kg)   LMP 09/08/2010   SpO2 98%   BMI 43.57 kg/m    Review of Systems She also has right shoulder pain and right knee pain.     Objective:   Physical Exam VITAL SIGNS:  See vs page.  GENERAL: no distress.  Right shoulder: full ROM, but ROM is painful.   Left foot: no tend/swell.  DP pulse is intact.     Lab Results  Component Value Date   CREATININE 0.82 10/10/2016   BUN 16 10/10/2016   NA 143 10/10/2016   K 3.6 10/10/2016   CL 108 10/10/2016   CO2 27 10/10/2016       Assessment & Plan:  HTN: poss exac by pain.  Recheck next time Mult areas of MSK pain, worse.   Patient  Instructions  X-rays are requested for you today.  We'll let you know about the results. Please see a specialist.  you will receive a phone call, about a day and time for an appointment.

## 2017-08-19 ENCOUNTER — Encounter (INDEPENDENT_AMBULATORY_CARE_PROVIDER_SITE_OTHER): Payer: Self-pay | Admitting: Orthopaedic Surgery

## 2017-08-19 ENCOUNTER — Ambulatory Visit (INDEPENDENT_AMBULATORY_CARE_PROVIDER_SITE_OTHER): Payer: BLUE CROSS/BLUE SHIELD | Admitting: Orthopaedic Surgery

## 2017-08-19 VITALS — BP 174/96 | HR 80 | Resp 18 | Ht 65.0 in | Wt 261.0 lb

## 2017-08-19 DIAGNOSIS — M25511 Pain in right shoulder: Secondary | ICD-10-CM

## 2017-08-19 DIAGNOSIS — M19041 Primary osteoarthritis, right hand: Secondary | ICD-10-CM

## 2017-08-19 DIAGNOSIS — M2550 Pain in unspecified joint: Secondary | ICD-10-CM | POA: Diagnosis not present

## 2017-08-19 MED ORDER — DICLOFENAC SODIUM 1 % TD GEL: 2.0000 g | Freq: Four times a day (QID) | TRANSDERMAL | 2 refills | Status: AC

## 2017-08-19 NOTE — Progress Notes (Signed)
Office Visit Note   Patient: Ashley Duncan           Date of Birth: 09-Apr-1961           MRN: [id number] Visit Date: 08/19/2017              Requested by: Renato Shin, MD [address] [place], [place] [address] PCP: Renato Shin, MD   Assessment & Plan: Visit Diagnoses:  1. Sternoclavicular joint pain, right   2. Osteoarthritis, hand, primary localized, right   3. Pain in joint, multiple sites     Plan:  #1: Referral to hand surgery for evaluation of her right first [facility] joint #2: Referral to thoracic surgery for evaluation of right [place] joint arthritis and pain #3: We will obtain laboratory studies looking for some type of rheumatologic cause of her arthritis. #4: She can call us for her lab results next week. #5: She was not interested in any corticosteroid injections.  However I have sent a prescription in for Voltaren gel which she can use see if it has a benefit.  Follow-Up Instructions: Return if symptoms worsen or fail to improve.   I AND  Dr. Durward Fortes have spent at least 1 hour face-to-face with Ashley Duncan going over all of her problems and possible solutions.  Orders:  Orders Placed This Encounter  Procedures  . CBC with Differential/Platelet  . COMPLETE METABOLIC PANEL WITH GFR  . Sedimentation rate  . ANA  . Rheumatoid factor  . HLA-B27 antigen  . Ambulatory referral to Cardiothoracic Surgery  . Ambulatory referral to Hand Surgery   Meds ordered this encounter  Medications  . diclofenac sodium (VOLTAREN) 1 % GEL    Sig: Apply 2-4 g topically 4 (four) times daily.    Dispense:  3 Tube    Refill:  2    Order Specific Question:   Supervising Provider    Answer:   Garald Balding [[id number]]      Procedures: No procedures performed   Clinical Data: No additional findings.   Subjective: Chief Complaint  Patient presents with  . New Patient (Initial Visit)    right nodule or bone spur on collar bone, right thumb pain for years and left  foot pain for 1 month. no injury or injections.    HPI  Ashley Duncan is a 57 year old African-American female seen today for multiple problems.  She comes in today complaining of mainly right hand pain at the first MCP joint and her right knee.  She states she has had chronic problems with this for many years.  The right hand is gotten more symptomatic and certainly is more difficult to grasp objects and to write because of the pain discomfort.  She states that she has been riddled with osteoarthritis.  At one point in time they felt that she had psoriatic arthritis and did place her on Humira which did not have any benefit.  She therefore had stopped the medication.  She has not had any further evaluation.  She also is complaining of pain and discomfort as well as the feeling of swelling in the right internal clavicular joint.  Apparently has had these symptoms previously and that had x-rays and a chest CT in the past years.  She states this is now bothering her more.  She would like to have evaluation and possible surgical intervention on this.  She states though she is not interested in any injections.  She also complains of midfoot pain right greater  than left.  She has had previous Achilles tendinitis and heel spurs on the right which has burned out.  She is wearing shoes that do not have a posterior elevation.  She does though have a higher heel which does also help her.  This unfortunately does cause more problems for her right knee pain and arthritis.  Seen today for evaluation.  Review of Systems  Constitutional: Negative for fatigue.  HENT: Negative for ear pain.   Eyes: Negative for pain.  Respiratory: Negative for cough and shortness of breath.   Cardiovascular: Positive for leg swelling.  Gastrointestinal: Negative for constipation and diarrhea.  Genitourinary: Negative for difficulty urinating.  Musculoskeletal: Positive for back pain and neck pain.  Skin: Negative for rash.    Allergic/Immunologic: Negative for food allergies.  Neurological: Positive for weakness and numbness.  Hematological: Does not bruise/bleed easily.  Psychiatric/Behavioral: Positive for sleep disturbance.     Objective: Vital Signs: BP (!) 174/96 (BP Location: Left Arm, Patient Position: Sitting, Cuff Size: Normal)   Pulse 80   Resp 18   Ht '5\' 5"'  (1.651 m)   Wt 261 lb (118.4 kg)   LMP 09/08/2010   BMI 43.43 kg/m   Physical Exam  Constitutional: She is oriented to person, place, and time. She appears well-developed and well-nourished.  HENT:  Head: Normocephalic and atraumatic.  Eyes: Pupils are equal, round, and reactive to light. EOM are normal.  Pulmonary/Chest: Effort normal.  Neurological: She is alert and oriented to person, place, and time.  Skin: Skin is warm and dry.  Psychiatric: She has a normal mood and affect. Her behavior is normal. Judgment and thought content normal.    Ortho Exam  In regards to her right hand she has what appears to be a subluxation of the MCP joint.  She has tenderness to palpation over the first MCP joint.  No particular swelling in the hand otherwise.  Good capillary refill.  IP joint does not elicit much pain.  In regards to her right sternoclavicular joint she does have tenderness to palpation over this area.  It does bother her as she does a cross chest maneuver.  She is got good motion of her right shoulder which does elicit some pain at the [facility] joint.  Not particularly painful at the shoulder itself.  In regards to her right knee she has range of motion from near full extension to 100 degrees.  She does have pseudolaxity with valgus stressing with good endpoint.  Does have crepitance with range of motion.  Pain mainly along the lateral aspect of the knee more than the medial aspect.  Difficult to tell whether she has effusion but I feel that she does have at least a trace effusion.  No warmth or erythema.  She does have good motion of her  hips.  Her right foot does have some tenderness to palpation over the midfoot.  Not particular painful to palpation over the Achilles insertion of the plantar fascia.  She does have hallux valgus noted.  Some tenderness over the first MTP joint.  Decreased ankle motion on the right.  Specialty Comments:  No specialty comments available.  Imaging: Dg Shoulder Right  Result Date: 08/11/2017 CLINICAL DATA:  Chronic RIGHT shoulder pain with decreased range of motion, increased pain with abduction EXAM: RIGHT SHOULDER - 2+ VIEW COMPARISON:  None FINDINGS: Osseous mineralization normal. AC joint alignment normal. No acute fracture, dislocation or bone destruction. Calcification adjacent to greater tuberosity of humerus question chronic calcific  tendinitis of the rotator cuff. Visualized RIGHT ribs unremarkable. Multilevel endplate spur formation thoracic spine. IMPRESSION: Question chronic calcific tendinitis of the RIGHT rotator cuff. Otherwise negative exam. Electronically Signed   By: Lavonia Dana M.D.   On: 08/11/2017 11:19   Dg Ankle Complete Left  Result Date: 08/11/2017 CLINICAL DATA:  Chronic lateral pain and swelling EXAM: LEFT ANKLE COMPLETE - 3+ VIEW COMPARISON:  None FINDINGS: Osseous mineralization normal. Joint spaces preserved. Old calcified ossicle at tip of medial malleolus. No acute fracture, dislocation, or bone destruction. Bulky plantar and especially Achilles insertion calcaneal spur formation. IMPRESSION: No acute osseous abnormalities. Extensive calcaneal spurring especially at Achilles insertion. Electronically Signed   By: Lavonia Dana M.D.   On: 08/11/2017 11:09   Dg Knee Complete 4 Views Right  Result Date: 08/11/2017 CLINICAL DATA:  Chronic RIGHT knee pain, osteoarthritis, medial pain and swelling, no known injury EXAM: RIGHT KNEE - COMPLETE 4+ VIEW COMPARISON:  08/14/2016 FINDINGS: Osseous mineralization grossly normal. Tricompartmental osteoarthritic changes with joint space  narrowing and spur formation, least severe at lateral compartment. No acute fracture, dislocation or bone destruction. No knee joint effusion. IMPRESSION: Significant osteoarthritic changes of the RIGHT knee. No acute bony abnormalities. Electronically Signed   By: Lavonia Dana M.D.   On: 08/11/2017 11:16   Dg Hand Complete Right  Result Date: 08/11/2017 CLINICAL DATA:  Chronic RIGHT hand pain, osteoarthritis, pain is worse at the RIGHT thumb at the MCP joint EXAM: RIGHT HAND - COMPLETE 3+ VIEW COMPARISON:  None FINDINGS: Osseous mineralization normal. Minimal narrowing and subchondral cystic changes at the first MCP joint. Remaining joint spaces preserved. No acute fracture, dislocation, or bone destruction. IMPRESSION: Mild degenerative changes at the RIGHT first MCP joint. Electronically Signed   By: Lavonia Dana M.D.   On: 08/11/2017 11:21   Dg Foot Complete Left  Result Date: 08/11/2017 CLINICAL DATA:  Chronic lateral foot and ankle pain with swelling, no known injury EXAM: LEFT FOOT - COMPLETE 3+ VIEW COMPARISON:  None FINDINGS: Osseous mineralization normal. Joint spaces preserved. No acute fracture or dislocation. Question subchondral cyst at medial first metatarsal head. Bulky plantar and especially Achilles insertion calcaneal spur formation. IMPRESSION: Calcaneal spurring. No acute osseous abnormalities. Electronically Signed   By: Lavonia Dana M.D.   On: 08/11/2017 11:11    PMFS History: Current Outpatient Medications  Medication Sig Dispense Refill  . ibuprofen (ADVIL,MOTRIN) 100 MG/5ML suspension Take 200 mg by mouth every 4 (four) hours as needed.    Marland Kitchen acetaminophen (TYLENOL) 325 MG tablet Take 650 mg by mouth every 6 (six) hours as needed.    . cetirizine (ZYRTEC) 10 MG tablet Take 1 tablet (10 mg total) by mouth daily. (Patient not taking: Reported on 08/19/2017) 15 tablet 0  . diclofenac sodium (VOLTAREN) 1 % GEL Apply 2-4 g topically 4 (four) times daily. 3 Tube 2  . fluticasone  (FLONASE) 50 MCG/ACT nasal spray Place 2 sprays into both nostrils daily. (Patient not taking: Reported on 08/19/2017) 1 g 0  . Fluticasone-Salmeterol (ADVAIR) 100-50 MCG/DOSE AEPB Inhale 1 puff into the lungs 2 (two) times daily. (Patient not taking: Reported on 08/19/2017) 1 each 3  . methocarbamol (ROBAXIN) 500 MG tablet Take 1 tablet (500 mg total) by mouth 4 (four) times daily. (Patient not taking: Reported on 08/19/2017) 60 tablet 1  . Polyethyl Glycol-Propyl Glycol (SYSTANE) 0.4-0.3 % GEL ophthalmic gel Place 1 application into both eyes at bedtime as needed. (Patient not taking: Reported on 08/19/2017) 1 Bottle  0   No current facility-administered medications for this visit.      Patient Active Problem List   Diagnosis Date Noted  . Left foot pain 08/11/2017  . Shoulder pain, right 08/11/2017  . Chronic pain of right thumb 08/11/2017  . Low back pain 07/26/2016  . Gallstone 10/10/2014  . Right knee pain 10/10/2014  . Wellness examination 10/06/2014  . Hyperglycemia 07/16/2013  . Myalgia 05/18/2012  . Numbness 05/18/2012  . Routine general medical examination at a health care facility 02/05/2011  . HYPERTENSION 01/29/2010  . COUGH 03/17/2009  . PULMONARY NODULE, RIGHT UPPER LOBE 04/05/2008  . HYPERCHOLESTEROLEMIA 12/08/2007  . FATIGUE 11/30/2007  . SWELLING MASS OR LUMP IN HEAD AND NECK 10/01/2007  . ANXIETY 02/21/2007  . DEPRESSION 02/21/2007  . OSTEOARTHRITIS 02/21/2007   Past Medical History:  Diagnosis Date  . ANEMIA, IRON DEFICIENCY 01/23/2009  . ANXIETY 02/21/2007  . DEPRESSION 02/21/2007  . HYPERCHOLESTEROLEMIA 12/08/2007  . HYPERTENSION 01/29/2010  . OSTEOARTHRITIS 02/21/2007   C Spine, Bilateral knees, heel spurs, right [place] osteophyte on CT 10/2007  . PULMONARY NODULE, RIGHT UPPER LOBE 04/05/2008  . SWELLING MASS OR LUMP IN HEAD AND NECK 10/01/2007    Family History  Problem Relation Age of Onset  . Diabetes Mother   . Cancer Sister        Breast Cancer  .  Diabetes Sister   . Diabetes Brother   . Diabetes Sister     Past Surgical History:  Procedure Laterality Date  . BUNIONECTOMY    . TUBAL LIGATION     Social History   Occupational History  . 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 Smoking status: Never Smoker  . Smokeless tobacco: Never Used  Substance and Sexual Activity  . Alcohol use: Yes    Comment: rare  . Drug use: Never  . Sexual activity: Not on file

## 2017-08-21 LAB — COMPLETE METABOLIC PANEL WITH GFR
AG Ratio: 1.5 (calc) (ref 1.0–2.5)
ALKALINE PHOSPHATASE (APISO): 79 U/L (ref 33–130)
ALT: 15 U/L (ref 6–29)
AST: 21 U/L (ref 10–35)
Albumin: 4.2 g/dL (ref 3.6–5.1)
BUN: 15 mg/dL (ref 7–25)
CALCIUM: 9.3 mg/dL (ref 8.6–10.4)
CO2: 29 mmol/L (ref 20–32)
CREATININE: 0.76 mg/dL (ref 0.50–1.05)
Chloride: 106 mmol/L (ref 98–110)
GFR, EST NON AFRICAN AMERICAN: 86 mL/min/{1.73_m2} (ref >=60)
GFR, Est African American: 100 mL/min/{1.73_m2} (ref >=60)
GLUCOSE: 79 mg/dL (ref 65–99)
Globulin: 2.8 g/dL (calc) (ref 1.9–3.7)
Potassium: 4 mmol/L (ref 3.5–5.3)
Sodium: 141 mmol/L (ref 135–146)
Total Bilirubin: 0.3 mg/dL (ref 0.2–1.2)
Total Protein: 7 g/dL (ref 6.1–8.1)

## 2017-08-21 LAB — SEDIMENTATION RATE: Sed Rate: 31 mm/h — ABNORMAL HIGH (ref 0–30)

## 2017-08-21 LAB — CBC WITH DIFFERENTIAL/PLATELET
BASOS PCT: 0.7 %
Basophils Absolute: 47 cells/uL (ref 0–200)
Eosinophils Absolute: 308 cells/uL (ref 15–500)
Eosinophils Relative: 4.6 %
HEMATOCRIT: 38.4 % (ref 35.0–45.0)
Hemoglobin: 12.7 g/dL (ref 11.7–15.5)
LYMPHS ABS: 2097 {cells}/uL (ref 850–3900)
MCH: 28.3 pg (ref 27.0–33.0)
MCHC: 33.1 g/dL (ref 32.0–36.0)
MCV: 85.7 fL (ref 80.0–100.0)
MPV: 10 fL (ref 7.5–12.5)
Monocytes Relative: 7.2 %
Neutro Abs: 3765 cells/uL (ref 1500–7800)
Neutrophils Relative %: 56.2 %
PLATELETS: 297 10*3/uL (ref 140–400)
RBC: 4.48 10*6/uL (ref 3.80–5.10)
RDW: 13.3 % (ref 11.0–15.0)
Total Lymphocyte: 31.3 %
WBC: 6.7 10*3/uL (ref 3.8–10.8)
WBCMIX: 482 {cells}/uL (ref 200–950)

## 2017-08-21 LAB — HLA-B27 ANTIGEN: HLA-B27 ANTIGEN: NEGATIVE

## 2017-08-21 LAB — RHEUMATOID FACTOR: Rhuematoid fact SerPl-aCnc: 15 IU/mL — ABNORMAL HIGH (ref <14)

## 2017-08-21 LAB — ANA: Anti Nuclear Antibody(ANA): NEGATIVE

## 2017-08-26 ENCOUNTER — Telehealth (INDEPENDENT_AMBULATORY_CARE_PROVIDER_SITE_OTHER): Payer: Self-pay | Admitting: Orthopaedic Surgery

## 2017-08-26 NOTE — Telephone Encounter (Signed)
Cindy from [facility] received the epic referral.  They are requesting a CT scan to be sent to their office.

## 2017-08-28 NOTE — Telephone Encounter (Signed)
Looked under imaging tab in patients chart, did not see any CT ordered. Left message for Arline AspCindy that pt would have to contact facility where CT was performed and sign a release to get CT sent to their office.

## 2017-08-29 ENCOUNTER — Ambulatory Visit (INDEPENDENT_AMBULATORY_CARE_PROVIDER_SITE_OTHER): Payer: Self-pay | Admitting: Orthopaedic Surgery

## 2017-09-18 NOTE — Progress Notes (Signed)
Office Visit Note  Patient: Ashley Duncan             Date of Birth: [birth year]           MRN: [id number]             PCP: Renato Shin, MD Referring: Renato Shin, MD Visit Date: 10/02/2017 Occupation: CNA    Subjective:  Bone spur on collarbone.   History of Present Illness: Ashley Duncan is a 57 y.o. female seen in consultation per request of her PCP.  According to patient several years ago she noticed a spur on her collarbone.  At that time she had CT scan of her neck which showed a pulmonary nodule as well.  She was told that the spur was due to osteoarthritis.  She states over time the spur is getting larger and she feels pressure over her esophagus.  She also has sometimes difficulties breathing because of that.  She states she has arthritis in almost all of her joints all her adult life.  She states sometimes her left foot swells.  She has had x-rays of her left foot in the past.  She also had arthritis in her right [facility] joint for which she had surgery 3 weeks ago by Dr. Burney Gauze.  She is gradually recovering from that.  None of the other joints are swollen.  She has had a history of right knee joint discomfort and had x-rays in the past which were consistent with osteoarthritis.  She has had problems with right shoulder joint rotator cuff and the x-ray showed calcific tendinitis.  She has had off-and-on lower back pain which is not currently present.  Activities of Daily Living:  Patient reports morning stiffness for 30  minutes.   Patient Denies nocturnal pain.  Difficulty dressing/grooming: Reports Difficulty climbing stairs: Reports Difficulty getting out of chair: Denies Difficulty using hands for taps, buttons, cutlery, and/or writing: Denies   Review of Systems  Constitutional: Negative for fatigue, fever, night sweats, weight gain and weight loss.  HENT: Negative for ear pain, mouth sores, trouble swallowing, trouble swallowing, mouth dryness and nose dryness.   Eyes:  Negative for pain, redness, visual disturbance and dryness.  Respiratory: Negative for cough, shortness of breath and difficulty breathing.   Cardiovascular: Negative for chest pain, palpitations, hypertension, irregular heartbeat and swelling in legs/feet.  Gastrointestinal: Negative for blood in stool, constipation and diarrhea.  Endocrine: Negative for increased urination.  Genitourinary: Negative for difficulty urinating and vaginal dryness.  Musculoskeletal: Positive for arthralgias, joint pain and morning stiffness. Negative for joint swelling, myalgias, muscle weakness, muscle tenderness and myalgias.  Skin: Negative for color change, rash, hair loss, skin tightness, ulcers and sensitivity to sunlight.  Allergic/Immunologic: Negative for susceptible to infections.  Neurological: Positive for numbness and weakness. Negative for dizziness, memory loss and night sweats.  Hematological: Negative for bruising/bleeding tendency and swollen glands.  Psychiatric/Behavioral: Negative for depressed mood and sleep disturbance. The patient is not nervous/anxious.     PMFS History:  Patient Active Problem List   Diagnosis Date Noted  . History of iron deficiency anemia 10/02/2017  . Pulmonary nodule 10/02/2017  . Left foot pain 08/11/2017  . Shoulder pain, right 08/11/2017  . Chronic pain of right thumb 08/11/2017  . Low back pain 07/26/2016  . Gallstone 10/10/2014  . Right knee pain 10/10/2014  . Wellness examination 10/06/2014  . Hyperglycemia 07/16/2013  . Myalgia 05/18/2012  . Numbness 05/18/2012  . Routine general medical examination at a health  care facility 02/05/2011  . HYPERTENSION 01/29/2010  . COUGH 03/17/2009  . PULMONARY NODULE, RIGHT UPPER LOBE 04/05/2008  . HYPERCHOLESTEROLEMIA 12/08/2007  . FATIGUE 11/30/2007  . SWELLING MASS OR LUMP IN HEAD AND NECK 10/01/2007  . Anxiety and depression 02/21/2007  . DEPRESSION 02/21/2007  . OSTEOARTHRITIS 02/21/2007    Past Medical  History:  Diagnosis Date  . ANEMIA, IRON DEFICIENCY 01/23/2009  . ANXIETY 02/21/2007  . DEPRESSION 02/21/2007  . HYPERCHOLESTEROLEMIA 12/08/2007  . HYPERTENSION 01/29/2010  . OSTEOARTHRITIS 02/21/2007   C Spine, Bilateral knees, heel spurs, right [place] osteophyte on CT 10/2007  . PULMONARY NODULE, RIGHT UPPER LOBE 04/05/2008  . SWELLING MASS OR LUMP IN HEAD AND NECK 10/01/2007    Family History  Problem Relation Age of Onset  . Diabetes Mother   . Cancer Sister        Breast Cancer  . Diabetes Sister   . Diabetes Brother   . Diabetes Sister    Past Surgical History:  Procedure Laterality Date  . BUNIONECTOMY    . THUMB FX    . TUBAL LIGATION     Social History   Social History Narrative   3 children, 1 died premature     Objective: Vital Signs: BP (!) 172/105 (BP Location: Left Arm, Patient Position: Sitting, Cuff Size: Normal)   Pulse 76   Ht '5\' 5"'  (1.651 m)   Wt 267 lb (121.1 kg)   LMP 09/08/2010   BMI 44.43 kg/m    Physical Exam  Constitutional: She is oriented to person, place, and time. She appears well-developed and well-nourished.  HENT:  Head: Normocephalic and atraumatic.  Eyes: Conjunctivae and EOM are normal.  Neck: Normal range of motion.  Cardiovascular: Normal rate, regular rhythm, normal heart sounds and intact distal pulses.  Pulmonary/Chest: Effort normal and breath sounds normal.  Abdominal: Soft. Bowel sounds are normal.  Lymphadenopathy:    She has no cervical adenopathy.  Neurological: She is alert and oriented to person, place, and time.  Skin: Skin is warm and dry. Capillary refill takes less than 2 seconds.  Psychiatric: She has a normal mood and affect. Her behavior is normal.  Nursing note and vitals reviewed.    Musculoskeletal Exam: C-spine thoracic lumbar spine good range of motion.  She has some discomfort with range of motion of the lumbar spine.  Shoulder joints elbow joints wrist joints with good range of motion.  Her right hand was  in a brace.  I do not see any synovitis in her left hand wrist joint MCPs or PIPs.  Hip joints are good range of motion.  She has some discomfort range of motion of her knee joints without any warmth swelling or effusion.  MTPs PIPs DIPs were in good range of motion with no synovitis.  CDAI Exam: No CDAI exam completed.    Investigation: Findings:  Chart review: Time spent in chart review was 30 minutes. August 11, 2017 x-ray of the right knee joint showed osteoarthritis of the right knee joint X-ray of the right hand showed first MCP arthritis X-ray of the left foot showed large calcaneal posterior spur X-ray of the lumbar spine showed facet joint arthropathy and L4-5 anterolisthesis June 2009 CT scan of the neck showed a spur over right sternoclavicular joint and congenital absence of left pectoral muscle.   Component     Latest Ref Rng & Units 08/19/2017  HLA-B27 Antigen     NEGATIVE NEGATIVE  RA Latex Turbid.     <  14 IU/mL 15 (H)  Anit Nuclear Antibody(ANA)     NEGATIVE NEGATIVE  Sed Rate     0 - 30 mm/h 31 (H)   CBC Latest Ref Rng & Units 08/19/2017 10/10/2016 10/09/2015  WBC 3.8 - 10.8 Thousand/uL 6.7 6.4 5.5  Hemoglobin 11.7 - 15.5 g/dL 12.7 12.5 13.0  Hematocrit 35.0 - 45.0 % 38.4 38.1 39.0  Platelets 140 - 400 Thousand/uL 297 280.0 307.0   CMP Latest Ref Rng & Units 08/19/2017 10/10/2016 10/09/2015  Glucose 65 - 99 mg/dL 79 78 85  BUN 7 - 25 mg/dL '15 16 14  ' Creatinine 0.50 - 1.05 mg/dL 0.76 0.82 0.74  Sodium 135 - 146 mmol/L 141 143 137  Potassium 3.5 - 5.3 mmol/L 4.0 3.6 3.8  Chloride 98 - 110 mmol/L 106 108 106  CO2 20 - 32 mmol/L '29 27 26  ' Calcium 8.6 - 10.4 mg/dL 9.3 9.5 9.2  Total Protein 6.1 - 8.1 g/dL 7.0 7.3 7.5  Total Bilirubin 0.2 - 1.2 mg/dL 0.3 0.3 0.2  Alkaline Phos 39 - 117 U/L - 75 72  AST 10 - 35 U/L '21 25 20  ' ALT 6 - 29 U/L '15 17 15    ' Imaging: No results found.  Speciality Comments: No specialty comments available.    Procedures:  No procedures  performed Allergies: Patient has no known allergies.   Assessment / Plan:     Visit Diagnoses: Pain of right sternoclavicular joint -patient has thickening of her right sternoclavicular joint.  Her RF is borderline positive.  I will obtain following labs today.  Plan: Cyclic citrul peptide antibody, IgG, 14-3-3 eta Protein.  She believes that the spot on the sternoclavicular joint is pressing on her esophagus in her trachea.  I will schedule MRI of her right sternoclavicular joint to evaluate this further.  Rheumatoid factor positive - RF15.  RF is borderline positive she has no synovitis on examination.  Her HLA-B27 was negative.  Her ANA was negative.  Elevated sed rate - Sed rate 31.  Low positive  Essential hypertension  Pulmonary nodule-patient had been followed by her PCP.  Anxiety and depression-episodic  History of iron deficiency anemia    Orders: Orders Placed This Encounter  Procedures  . MR CHEST WO CONTRAST  . Cyclic citrul peptide antibody, IgG  . 14-3-3 eta Protein   No orders of the defined types were placed in this encounter.   Face-to-face time spent with patient was 45 minutes>50% of time was spent in counseling and coordination of care.  Follow-Up Instructions: Return for Joint pain.   Bo Merino, MD  Note - This record has been created using Editor, commissioning.  Chart creation errors have been sought, but may not always  have been located. Such creation errors do not reflect on  the standard of medical care.

## 2017-10-02 ENCOUNTER — Ambulatory Visit: Payer: BLUE CROSS/BLUE SHIELD | Admitting: Rheumatology

## 2017-10-02 ENCOUNTER — Encounter: Payer: Self-pay | Admitting: Rheumatology

## 2017-10-02 VITALS — BP 172/105 | HR 76 | Ht 65.0 in | Wt 267.0 lb

## 2017-10-02 DIAGNOSIS — Z862 Personal history of diseases of the blood and blood-forming organs and certain disorders involving the immune mechanism: Secondary | ICD-10-CM | POA: Diagnosis not present

## 2017-10-02 DIAGNOSIS — R0782 Intercostal pain: Secondary | ICD-10-CM

## 2017-10-02 DIAGNOSIS — R700 Elevated erythrocyte sedimentation rate: Secondary | ICD-10-CM | POA: Diagnosis not present

## 2017-10-02 DIAGNOSIS — R911 Solitary pulmonary nodule: Secondary | ICD-10-CM | POA: Diagnosis not present

## 2017-10-02 DIAGNOSIS — F329 Major depressive disorder, single episode, unspecified: Secondary | ICD-10-CM | POA: Diagnosis not present

## 2017-10-02 DIAGNOSIS — R768 Other specified abnormal immunological findings in serum: Secondary | ICD-10-CM | POA: Diagnosis not present

## 2017-10-02 DIAGNOSIS — M25511 Pain in right shoulder: Secondary | ICD-10-CM

## 2017-10-02 DIAGNOSIS — F32A Depression, unspecified: Secondary | ICD-10-CM

## 2017-10-02 DIAGNOSIS — I10 Essential (primary) hypertension: Secondary | ICD-10-CM

## 2017-10-02 DIAGNOSIS — F419 Anxiety disorder, unspecified: Secondary | ICD-10-CM | POA: Diagnosis not present

## 2017-10-02 DIAGNOSIS — M791 Myalgia, unspecified site: Secondary | ICD-10-CM | POA: Diagnosis not present

## 2017-10-07 LAB — 14-3-3 ETA PROTEIN: 14-3-3 eta Protein: <0.2 ng/mL (ref <0.2)

## 2017-10-07 LAB — CYCLIC CITRUL PEPTIDE ANTIBODY, IGG

## 2017-10-07 NOTE — Progress Notes (Signed)
WNLs

## 2017-10-09 ENCOUNTER — Ambulatory Visit (HOSPITAL_COMMUNITY)
Admission: RE | Admit: 2017-10-09 | Discharge: 2017-10-09 | Disposition: A | Discharge status: Home / Self Care | Payer: BLUE CROSS/BLUE SHIELD | Source: Ambulatory Visit | Attending: Rheumatology | Admitting: Rheumatology

## 2017-10-09 DIAGNOSIS — M19011 Primary osteoarthritis, right shoulder: Secondary | ICD-10-CM | POA: Diagnosis not present

## 2017-10-09 DIAGNOSIS — R0782 Intercostal pain: Secondary | ICD-10-CM | POA: Diagnosis present

## 2017-10-09 DIAGNOSIS — M19012 Primary osteoarthritis, left shoulder: Secondary | ICD-10-CM | POA: Insufficient documentation

## 2017-10-10 ENCOUNTER — Ambulatory Visit: Payer: BLUE CROSS/BLUE SHIELD | Admitting: Endocrinology

## 2017-10-10 NOTE — Progress Notes (Signed)
The MRI shows sternoclavicular osteoarthritis.  There was no synovitis.

## 2017-10-13 NOTE — Progress Notes (Deleted)
Office Visit Note  Patient: Ashley Duncan             Date of Birth: 05-Apr-1961           MRN: [id number]             PCP: Renato Shin, MD Referring: Renato Shin, MD Visit Date: 10/23/2017 Occupation: '@GUAROCC' @    Subjective:  No chief complaint on file.   History of Present Illness: Ashley Duncan is a 57 y.o. female ***   Activities of Daily Living:  Patient reports morning stiffness for *** {minute/hour:19697}.   Patient {ACTIONS;DENIES/REPORTS:21021675::"Denies"} nocturnal pain.  Difficulty dressing/grooming: {ACTIONS;DENIES/REPORTS:21021675::"Denies"} Difficulty climbing stairs: {ACTIONS;DENIES/REPORTS:21021675::"Denies"} Difficulty getting out of chair: {ACTIONS;DENIES/REPORTS:21021675::"Denies"} Difficulty using hands for taps, buttons, cutlery, and/or writing: {ACTIONS;DENIES/REPORTS:21021675::"Denies"}   No Rheumatology ROS completed.   PMFS History:  Patient Active Problem List   Diagnosis Date Noted  . History of iron deficiency anemia 10/02/2017  . Pulmonary nodule 10/02/2017  . Left foot pain 08/11/2017  . Shoulder pain, right 08/11/2017  . Chronic pain of right thumb 08/11/2017  . Low back pain 07/26/2016  . Gallstone 10/10/2014  . Right knee pain 10/10/2014  . Wellness examination 10/06/2014  . Hyperglycemia 07/16/2013  . Myalgia 05/18/2012  . Numbness 05/18/2012  . Routine general medical examination at a health care facility 02/05/2011  . HYPERTENSION 01/29/2010  . COUGH 03/17/2009  . PULMONARY NODULE, RIGHT UPPER LOBE 04/05/2008  . HYPERCHOLESTEROLEMIA 12/08/2007  . FATIGUE 11/30/2007  . SWELLING MASS OR LUMP IN HEAD AND NECK 10/01/2007  . Anxiety and depression 02/21/2007  . DEPRESSION 02/21/2007  . OSTEOARTHRITIS 02/21/2007    Past Medical History:  Diagnosis Date  . ANEMIA, IRON DEFICIENCY 01/23/2009  . ANXIETY 02/21/2007  . DEPRESSION 02/21/2007  . HYPERCHOLESTEROLEMIA 12/08/2007  . HYPERTENSION 01/29/2010  . OSTEOARTHRITIS  02/21/2007   C Spine, Bilateral knees, heel spurs, right [place] osteophyte on CT 10/2007  . PULMONARY NODULE, RIGHT UPPER LOBE 04/05/2008  . SWELLING MASS OR LUMP IN HEAD AND NECK 10/01/2007    Family History  Problem Relation Age of Onset  . Diabetes Mother   . Cancer Sister        Breast Cancer  . Diabetes Sister   . Diabetes Brother   . Diabetes Sister    Past Surgical History:  Procedure Laterality Date  . BUNIONECTOMY    . THUMB FX    . TUBAL LIGATION     Social History   Social History Narrative   3 children, 1 died premature     Objective: Vital Signs: LMP 09/08/2010    Physical Exam   Musculoskeletal Exam: ***  CDAI Exam: No CDAI exam completed.    Investigation: No additional findings.  August 19, 2017 ESR 31, ANA negative, RF negative, HLA-B27 negative Oct 02, 2017 anti-CCP negative, '14 3 3 ' at a negative  Imaging: Mr Chest Wo Contrast  Result Date: 10/10/2017 CLINICAL DATA:  Pain about the right sternoclavicular joint. No known injury. EXAM: MRI CHEST WITHOUT CONTRAST TECHNIQUE: Multiplanar, multisequence MR imaging of the chest centered about the sternoclavicular joints was performed. No intravenous contrast was administered. COMPARISON:  None. FINDINGS: Bones/Joint/Cartilage Joint space narrowing and osteophytosis about the sternoclavicular joints are much worse on the right. There is mild subchondral edema about the right sternoclavicular joint and a small joint effusion. No erosion is identified. Ligaments Peer normal. Muscles and Tendons Intact and normal in appearance. Soft tissues Negative. IMPRESSION: Right much worse than left sternoclavicular joint osteoarthritis. The exam is otherwise  negative. Electronically Signed   By: Inge Rise M.D.   On: 10/10/2017 10:20    Speciality Comments: No specialty comments available.    Procedures:  No procedures performed Allergies: Patient has no known allergies.   Assessment / Plan:     Visit Diagnoses:  No diagnosis found.    Orders: No orders of the defined types were placed in this encounter.  No orders of the defined types were placed in this encounter.   Face-to-face time spent with patient was *** minutes. 50% of time was spent in counseling and coordination of care.  Follow-Up Instructions: No follow-ups on file.   Bo Merino, MD  Note - This record has been created using Editor, commissioning.  Chart creation errors have been sought, but may not always  have been located. Such creation errors do not reflect on  the standard of medical care.

## 2017-10-21 ENCOUNTER — Telehealth: Payer: Self-pay | Admitting: Rheumatology

## 2017-10-21 NOTE — Telephone Encounter (Signed)
Returned patient's call and advised patient to go to the emergency room per Dr. Corliss Skainseveshwar. Patient refused to go to the ER because her "collar bone has been like this for 10 years." Patient denied any shortness of breath. I offered patient a referral to an orthopedic surgeon and patient declined. Patient states she wanted to find a doctor to speak to without having to go in for an appointment, for a diagnosis and treatment plan. I advised patient that I could not speak for other physician offices but I did not know of any doctors that would do that, without seeing the patient for an appointment. Patient states she has been watching you-tube videos and researching her symptoms and she knows what her diagnosis is. Patient states she is not going to waste any more money on going to doctors and will call our office in the future if she is in need of any assistance. Patient had canceled her follow up appointment with Dr. Corliss Skainseveshwar.

## 2017-10-21 NOTE — Telephone Encounter (Signed)
Patient called stating she has been researching her symptoms and feels like her problem is that "her collar bone is floating and not attached to her sternum.'  Patient states she has tried different positions to get it back into place.    Patient is 100% positive that her problem is related to her collar bone and needs to find a doctor who can "pop it back into place" and has cancelled her follow-up appointment with Dr. Corliss Skainseveshwar.

## 2017-10-21 NOTE — Telephone Encounter (Signed)
Please call patient to go to the emergency room.

## 2017-10-23 ENCOUNTER — Ambulatory Visit: Payer: Self-pay | Admitting: Rheumatology

## 2018-02-02 ENCOUNTER — Encounter (HOSPITAL_COMMUNITY): Payer: Self-pay | Admitting: Emergency Medicine

## 2018-02-02 ENCOUNTER — Ambulatory Visit (HOSPITAL_COMMUNITY)
Admission: EM | Admit: 2018-02-02 | Discharge: 2018-02-02 | Disposition: A | Discharge status: Home / Self Care | Payer: BLUE CROSS/BLUE SHIELD | Attending: Family Medicine | Admitting: Family Medicine

## 2018-02-02 DIAGNOSIS — M62830 Muscle spasm of back: Secondary | ICD-10-CM | POA: Diagnosis not present

## 2018-02-02 MED ORDER — IBUPROFEN 600 MG PO TABS: 600.0000 mg | ORAL_TABLET | Freq: Four times a day (QID) | ORAL | 0 refills | Status: AC | PRN

## 2018-02-02 MED ORDER — KETOROLAC TROMETHAMINE 60 MG/2ML IM SOLN
60.0000 mg | Freq: Once | INTRAMUSCULAR | Status: AC
  Administered 2018-02-02: 60 mg via INTRAMUSCULAR

## 2018-02-02 MED ORDER — KETOROLAC TROMETHAMINE 60 MG/2ML IM SOLN
INTRAMUSCULAR | Status: AC
  Filled 2018-02-02: qty 2

## 2018-02-02 MED ORDER — CYCLOBENZAPRINE HCL 10 MG PO TABS: 10.0000 mg | ORAL_TABLET | Freq: Two times a day (BID) | ORAL | 0 refills | Status: AC | PRN

## 2018-02-02 NOTE — Discharge Instructions (Signed)
We gave you a shot of Toradol today, this should begin working approximately 30 minutes  Use anti-inflammatories for pain/swelling. You may take up to 800 mg Ibuprofen every 8 hours with food. You may supplement Ibuprofen with Tylenol [id number] mg every 8 hours.   You may use flexeril as needed to help with pain. This is a muscle relaxer and causes sedation- please use only at bedtime or when you will be home and not have to drive/work-may begin with half tablet if causing too much drowsiness.  Please follow-up if developing worsening back pain, cough, abdominal pain, nausea, vomiting, shortness of breath, numbness or tingling, changes in bladder or bowel movements.

## 2018-02-02 NOTE — ED Triage Notes (Signed)
PT reports lower back pain that has worked it's way up to her right side. PT reports it feels like muscle spasms. Started Friday night.

## 2018-02-03 NOTE — ED Provider Notes (Signed)
[facility]    CSN: [id number] Arrival date & time: 02/02/18  1116     History   Chief Complaint Chief Complaint  Patient presents with  . Back Pain    HPI Ashley Duncan is a 57 y.o. female history of hypertension, arthritis, hyperlipidemia presenting today for evaluation of back pain.  Patient states that she pulled a muscle on Thursday, symptoms worsened into Friday evening.  States that at work she was pushing a 250 pound band in a wheelchair and believes this is what exacerbated her symptoms.  She is having spasming and tightening with specific movements.  Mainly in her mid back.  Denies numbness or tingling.  Denies radiation into her lower extremities.  Denies weakness.  She has taken ibuprofen 400 every 6 hours.  Denies shortness of breath, cough, chest discomfort.  Denies abdominal pain, nausea or vomiting.  Denies urinary symptoms.  HPI  Past Medical History:  Diagnosis Date  . ANEMIA, IRON DEFICIENCY 01/23/2009  . ANXIETY 02/21/2007  . DEPRESSION 02/21/2007  . HYPERCHOLESTEROLEMIA 12/08/2007  . HYPERTENSION 01/29/2010  . OSTEOARTHRITIS 02/21/2007   C Spine, Bilateral knees, heel spurs, right [place] osteophyte on CT 10/2007  . PULMONARY NODULE, RIGHT UPPER LOBE 04/05/2008  . SWELLING MASS OR LUMP IN HEAD AND NECK 10/01/2007    Patient Active Problem List   Diagnosis Date Noted  . History of iron deficiency anemia 10/02/2017  . Pulmonary nodule 10/02/2017  . Left foot pain 08/11/2017  . Shoulder pain, right 08/11/2017  . Chronic pain of right thumb 08/11/2017  . Low back pain 07/26/2016  . Gallstone 10/10/2014  . Right knee pain 10/10/2014  . Wellness examination 10/06/2014  . Hyperglycemia 07/16/2013  . Myalgia 05/18/2012  . Numbness 05/18/2012  . Routine general medical examination at a health care facility 02/05/2011  . HYPERTENSION 01/29/2010  . COUGH 03/17/2009  . PULMONARY NODULE, RIGHT UPPER LOBE 04/05/2008  . HYPERCHOLESTEROLEMIA 12/08/2007  .  FATIGUE 11/30/2007  . SWELLING MASS OR LUMP IN HEAD AND NECK 10/01/2007  . Anxiety and depression 02/21/2007  . DEPRESSION 02/21/2007  . OSTEOARTHRITIS 02/21/2007    Past Surgical History:  Procedure Laterality Date  . BUNIONECTOMY    . THUMB FX    . TUBAL LIGATION      OB History   None      Home Medications    Prior to Admission medications   Medication Sig Start Date End Date Taking? Authorizing Provider  diclofenac sodium (VOLTAREN) 1 % GEL Apply 2-4 g topically 4 (four) times daily. 08/19/17  Yes Petrarca, Oris Drone, PA-C  acetaminophen (TYLENOL) 325 MG tablet Take 650 mg by mouth every 6 (six) hours as needed.    [provider]  cyclobenzaprine (FLEXERIL) 10 MG tablet Take 1 tablet (10 mg total) by mouth 2 (two) times daily as needed for muscle spasms. 02/02/18   Chrysta Fulcher C, PA-C  ibuprofen (ADVIL,MOTRIN) 600 MG tablet Take 1 tablet (600 mg total) by mouth every 6 (six) hours as needed. 02/02/18   Niomi Valent C, PA-C  Polyethyl Glycol-Propyl Glycol (SYSTANE) 0.4-0.3 % GEL ophthalmic gel Place 1 application into both eyes at bedtime as needed. 07/18/17   Belinda Fisher, PA-C    Family History Family History  Problem Relation Age of Onset  . Diabetes Mother   . Cancer Sister        Breast Cancer  . Diabetes Sister   . Diabetes Brother   . Diabetes Sister  Social History Social History   Tobacco Use  . Smoking status: Never Smoker  . Smokeless tobacco: Never Used  Substance Use Topics  . Alcohol use: Yes    Comment: rare  . Drug use: Never     Allergies   Patient has no known allergies.   Review of Systems Review of Systems  Constitutional: Negative for fatigue and fever.  Eyes: Negative for visual disturbance.  Respiratory: Negative for shortness of breath.   Cardiovascular: Negative for chest pain.  Gastrointestinal: Negative for abdominal pain, nausea and vomiting.  Musculoskeletal: Positive for back pain and myalgias. Negative for  arthralgias and joint swelling.  Skin: Negative for color change, rash and wound.  Neurological: Negative for dizziness, weakness, light-headedness and headaches.     Physical Exam Triage Vital Signs ED Triage Vitals  Enc Vitals Group     BP 02/02/18 1231 (!) 169/114     Pulse Rate 02/02/18 1229 69     Resp 02/02/18 1229 16     Temp 02/02/18 1229 98.3 F (36.8 C)     Temp Source 02/02/18 1229 Oral     SpO2 02/02/18 1229 99 %     Weight --      Height --      Head Circumference --      Peak Flow --      Pain Score 02/02/18 1227 9     Pain Loc --      Pain Edu? --      Excl. in GC? --    No data found.  Updated Vital Signs BP (!) 169/114   Pulse 69   Temp 98.3 F (36.8 C) (Oral)   Resp 16   LMP 09/08/2010   SpO2 99%   Visual Acuity Right Eye Distance:   Left Eye Distance:   Bilateral Distance:    Right Eye Near:   Left Eye Near:    Bilateral Near:     Physical Exam  Constitutional: She is oriented to person, place, and time. She appears well-developed and well-nourished. No distress.  HENT:  Head: Normocephalic and atraumatic.  Eyes: Conjunctivae are normal.  Neck: Neck supple.  Cardiovascular: Normal rate and regular rhythm.  No murmur heard. Pulmonary/Chest: Effort normal and breath sounds normal. No respiratory distress.  Breathing comfortably at rest, CTABL, no wheezing, rales or other adventitious sounds auscultated   Abdominal: Soft. There is no tenderness.  Musculoskeletal: She exhibits no edema.  Nontender to palpation of cervical, thoracic and lumbar spine, tenderness to palpation of lateral thoracic musculature, pain more elicited with movement, nontender to the lumbar musculature bilaterally.  Hip strength 5/5 and equal bilaterally in all directions, negative straight leg raise bilaterally  Neurological: She is alert and oriented to person, place, and time.  Skin: Skin is warm and dry.  Psychiatric: She has a normal mood and affect.  Nursing  note and vitals reviewed.    UC Treatments / Results  Labs (all labs ordered are listed, but only abnormal results are displayed) Labs Reviewed - No data to display  EKG None  Radiology No results found.  Procedures Procedures (including critical care time)  Medications Ordered in UC Medications  ketorolac (TORADOL) injection 60 mg (60 mg Intramuscular Given 02/02/18 1318)    Initial Impression / Assessment and Plan / UC Course  I have reviewed the triage vital signs and the nursing notes.  Pertinent labs & imaging results that were available during my care of the patient were reviewed by me  and considered in my medical decision making (see chart for details).     Patient likely with muscle strain, muscle spasm, will treat with anti-inflammatories.  Will provide Toradol in clinic today, will send home and advised Tylenol and ibuprofen, will limit this as much as possible as patient has diverticulitis and is often flared this up.  Flexeril provided, discussed sedation regarding Flexeril and advised to begin with half tablet, advised not to work or drive while taking.  No heavy lifting at work.  Discussed strict return precautions. Patient verbalized understanding and is agreeable with plan.  Final Clinical Impressions(s) / UC Diagnoses   Final diagnoses:  Muscle spasm of back     Discharge Instructions     We gave you a shot of Toradol today, this should begin working approximately 30 minutes  Use anti-inflammatories for pain/swelling. You may take up to 800 mg Ibuprofen every 8 hours with food. You may supplement Ibuprofen with Tylenol [phone] mg every 8 hours.   You may use flexeril as needed to help with pain. This is a muscle relaxer and causes sedation- please use only at bedtime or when you will be home and not have to drive/work-may begin with half tablet if causing too much drowsiness.  Please follow-up if developing worsening back pain, cough, abdominal pain,  nausea, vomiting, shortness of breath, numbness or tingling, changes in bladder or bowel movements.   ED Prescriptions    Medication Sig Dispense Auth. Provider   ibuprofen (ADVIL,MOTRIN) 600 MG tablet Take 1 tablet (600 mg total) by mouth every 6 (six) hours as needed. 30 tablet Jaedynn Bohlken C, PA-C   cyclobenzaprine (FLEXERIL) 10 MG tablet Take 1 tablet (10 mg total) by mouth 2 (two) times daily as needed for muscle spasms. 20 tablet Tiana Sivertson, [place] C, PA-C     Controlled Substance Prescriptions [place] Controlled Substance Registry consulted? Not Applicable   Lew Dawes, [place] 02/03/18 1517

## 2018-02-13 ENCOUNTER — Encounter: Payer: BLUE CROSS/BLUE SHIELD | Admitting: Nurse Practitioner

## 2018-02-13 ENCOUNTER — Other Ambulatory Visit (INDEPENDENT_AMBULATORY_CARE_PROVIDER_SITE_OTHER): Payer: BLUE CROSS/BLUE SHIELD

## 2018-02-13 ENCOUNTER — Ambulatory Visit: Payer: BLUE CROSS/BLUE SHIELD | Admitting: Family

## 2018-02-13 ENCOUNTER — Encounter: Payer: Self-pay | Admitting: Family

## 2018-02-13 VITALS — BP 162/98 | HR 73 | Temp 98.1°F | Ht 65.0 in | Wt 263.1 lb

## 2018-02-13 DIAGNOSIS — Z0000 Encounter for general adult medical examination without abnormal findings: Secondary | ICD-10-CM

## 2018-02-13 DIAGNOSIS — E162 Hypoglycemia, unspecified: Secondary | ICD-10-CM

## 2018-02-13 DIAGNOSIS — E559 Vitamin D deficiency, unspecified: Secondary | ICD-10-CM

## 2018-02-13 DIAGNOSIS — Z13220 Encounter for screening for lipoid disorders: Secondary | ICD-10-CM | POA: Diagnosis not present

## 2018-02-13 DIAGNOSIS — Z23 Encounter for immunization: Secondary | ICD-10-CM

## 2018-02-13 LAB — COMPREHENSIVE METABOLIC PANEL
ALT: 15 U/L (ref 0–35)
AST: 18 U/L (ref 0–37)
Albumin: 4.4 g/dL (ref 3.5–5.2)
Alkaline Phosphatase: 77 U/L (ref 39–117)
BILIRUBIN TOTAL: 0.3 mg/dL (ref 0.2–1.2)
BUN: 16 mg/dL (ref 6–23)
CO2: 30 meq/L (ref 19–32)
CREATININE: 0.77 mg/dL (ref 0.40–1.20)
Calcium: 9.4 mg/dL (ref 8.4–10.5)
Chloride: 105 mEq/L (ref 96–112)
GFR: 98.54 mL/min (ref >60.00)
GLUCOSE: 73 mg/dL (ref 70–99)
Potassium: 3.8 mEq/L (ref 3.5–5.1)
SODIUM: 142 meq/L (ref 135–145)
Total Protein: 7.8 g/dL (ref 6.0–8.3)

## 2018-02-13 LAB — CBC WITH DIFFERENTIAL/PLATELET
Basophils Absolute: 0.1 10*3/uL (ref 0.0–0.1)
Basophils Relative: 1 % (ref 0.0–3.0)
EOS ABS: 0.4 10*3/uL (ref 0.0–0.7)
Eosinophils Relative: 6 % — ABNORMAL HIGH (ref 0.0–5.0)
HCT: 39.8 % (ref 36.0–46.0)
Hemoglobin: 12.8 g/dL (ref 12.0–15.0)
LYMPHS ABS: 2.5 10*3/uL (ref 0.7–4.0)
Lymphocytes Relative: 36.4 % (ref 12.0–46.0)
MCHC: 32.2 g/dL (ref 30.0–36.0)
MCV: 88.8 fl (ref 78.0–100.0)
MONOS PCT: 9.1 % (ref 3.0–12.0)
Monocytes Absolute: 0.6 10*3/uL (ref 0.1–1.0)
NEUTROS ABS: 3.3 10*3/uL (ref 1.4–7.7)
Neutrophils Relative %: 47.5 % (ref 43.0–77.0)
Platelets: 282 10*3/uL (ref 150.0–400.0)
RBC: 4.48 Mil/uL (ref 3.87–5.11)
RDW: 14.1 % (ref 11.5–15.5)
WBC: 7 10*3/uL (ref 4.0–10.5)

## 2018-02-13 LAB — HEMOGLOBIN A1C: Hgb A1c MFr Bld: 5.8 % (ref 4.6–6.5)

## 2018-02-13 LAB — LIPID PANEL
CHOL/HDL RATIO: 3
Cholesterol: 187 mg/dL (ref 0–200)
HDL: 59 mg/dL (ref >39.00)
LDL Cholesterol: 117 mg/dL — ABNORMAL HIGH (ref 0–99)
NONHDL: 128.28
Triglycerides: 56 mg/dL (ref 0.0–149.0)
VLDL: 11.2 mg/dL (ref 0.0–40.0)

## 2018-02-13 LAB — TSH: TSH: 2.43 u[IU]/mL (ref 0.35–4.50)

## 2018-02-13 LAB — VITAMIN D 25 HYDROXY (VIT D DEFICIENCY, FRACTURES): VITD: 28.82 ng/mL — ABNORMAL LOW (ref 30.00–100.00)

## 2018-02-13 NOTE — Progress Notes (Signed)
Ashley Duncan is a 57 y.o. female with the following history as recorded in [employer]:  Patient Active Problem List   Diagnosis Date Noted  . History of iron deficiency anemia 10/02/2017  . Pulmonary nodule 10/02/2017  . Left foot pain 08/11/2017  . Shoulder pain, right 08/11/2017  . Chronic pain of right thumb 08/11/2017  . Low back pain 07/26/2016  . Gallstone 10/10/2014  . Right knee pain 10/10/2014  . Wellness examination 10/06/2014  . Hyperglycemia 07/16/2013  . Myalgia 05/18/2012  . Numbness 05/18/2012  . Routine general medical examination at a health care facility 02/05/2011  . HYPERTENSION 01/29/2010  . COUGH 03/17/2009  . PULMONARY NODULE, RIGHT UPPER LOBE 04/05/2008  . HYPERCHOLESTEROLEMIA 12/08/2007  . FATIGUE 11/30/2007  . SWELLING MASS OR LUMP IN HEAD AND NECK 10/01/2007  . Anxiety and depression 02/21/2007  . DEPRESSION 02/21/2007  . OSTEOARTHRITIS 02/21/2007    Current Outpatient Medications  Medication Sig Dispense Refill  . acetaminophen (TYLENOL) 325 MG tablet Take 650 mg by mouth every 6 (six) hours as needed.    . cyclobenzaprine (FLEXERIL) 10 MG tablet Take 1 tablet (10 mg total) by mouth 2 (two) times daily as needed for muscle spasms. 20 tablet 0  . diclofenac sodium (VOLTAREN) 1 % GEL Apply 2-4 g topically 4 (four) times daily. 3 Tube 2  . ibuprofen (ADVIL,MOTRIN) 600 MG tablet Take 1 tablet (600 mg total) by mouth every 6 (six) hours as needed. 30 tablet 0  . Polyethyl Glycol-Propyl Glycol (SYSTANE) 0.4-0.3 % GEL ophthalmic gel Place 1 application into both eyes at bedtime as needed. 1 Bottle 0   No current facility-administered medications for this visit.     Allergies: Patient has no known allergies.  Past Medical History:  Diagnosis Date  . ANEMIA, IRON DEFICIENCY 01/23/2009  . ANXIETY 02/21/2007  . DEPRESSION 02/21/2007  . HYPERCHOLESTEROLEMIA 12/08/2007  . HYPERTENSION 01/29/2010  . OSTEOARTHRITIS 02/21/2007   C Spine, Bilateral knees, heel  spurs, right [place] osteophyte on CT 10/2007  . PULMONARY NODULE, RIGHT UPPER LOBE 04/05/2008  . SWELLING MASS OR LUMP IN HEAD AND NECK 10/01/2007    Past Surgical History:  Procedure Laterality Date  . BUNIONECTOMY    . THUMB FX    . TUBAL LIGATION      Family History  Problem Relation Age of Onset  . Diabetes Mother   . Cancer Sister        Breast Cancer  . Diabetes Sister   . Diabetes Brother   . Diabetes Sister     Social History   Tobacco Use  . Smoking status: Never Smoker  . Smokeless tobacco: Never Used  Substance Use Topics  . Alcohol use: Yes    Comment: rare    Subjective:  Patient presents to transfer care from Dr. Loanne Drilling; would like to get her CPE updated today; sees GYN regularly- had exam there on Monday including pap smear and mammogram; Notes that she has "white coat hypertension" and is claustrophobic; feels that blood pressure today is not an accurate reading;  Notes that "major problem" is arthritis- does not want to see rheumatologist at this time. Takes 2 Advil twice a day;  Eye exam- up to date; dentist every 6 months;   Review of Systems  Constitutional: Negative.   HENT: Negative for hearing loss.   Eyes: Negative for blurred vision.  Respiratory: Negative for cough and shortness of breath.   Cardiovascular: Negative for chest pain.  Gastrointestinal: Negative for abdominal pain, constipation  and diarrhea.  Genitourinary: Negative.   Musculoskeletal: Positive for joint pain.  Skin: Negative.   Neurological: Negative for dizziness, weakness and headaches.  Psychiatric/Behavioral: Negative for depression. The patient is not nervous/anxious and does not have insomnia.       Objective:  Vitals:   02/13/18 0913  BP: (!) 162/98  Pulse: 73  Temp: 98.1 F (36.7 C)  TempSrc: Oral  SpO2: 98%  Weight: 263 lb 1.3 oz (119.3 kg)  Height: '5\' 5"'  (1.651 m)    General: Well developed, well nourished, in no acute distress  Skin : Warm and dry.  Head:  Normocephalic and atraumatic  Eyes: Sclera and conjunctiva clear; pupils round and reactive to light; extraocular movements intact  Ears: External normal; canals clear; tympanic membranes normal  Oropharynx: Pink, supple. No suspicious lesions  Neck: Supple without thyromegaly, adenopathy  Lungs: Respirations unlabored; clear to auscultation bilaterally without wheeze, rales, rhonchi  CVS exam: normal rate and regular rhythm.  Abdomen: Soft; nontender; nondistended; normoactive bowel sounds; no masses or hepatosplenomegaly  Musculoskeletal: No deformities; no active joint inflammation  Extremities: No edema, cyanosis, clubbing  Vessels: Symmetric bilaterally  Neurologic: Alert and oriented; speech intact; face symmetrical; moves all extremities well; CNII-XII intact without focal deficit   Assessment:  1. PE (physical exam), annual   2. Lipid screening   3. Vitamin D deficiency   4. Hypoglycemia   5. Need for influenza vaccination     Plan:  Age appropriate preventive healthcare needs addressed; encouraged regular eye doctor and dental exams; encouraged regular exercise; will update labs and refills as needed today; follow-up to be determined; She has GYN who is managing her pap smear, mammogram; Am concerned about her blood pressure- patient feels she has "white coat hypertension"- have asked her to start checking her blood pressure daily and send readings for review within 2 weeks; may need to consider low dose blood pressure medication; Flu shot given today;    No follow-ups on file.  Orders Placed This Encounter  Procedures  . Flu Vaccine QUAD 36+ mos IM  . CBC w/Diff    Standing Status:   Future    Number of Occurrences:   1    Standing Expiration Date:   02/13/2019  . Comp Met (CMET)    Standing Status:   Future    Number of Occurrences:   1    Standing Expiration Date:   02/13/2019  . Lipid panel    Standing Status:   Future    Number of Occurrences:   1    Standing  Expiration Date:   02/14/2019  . TSH    Standing Status:   Future    Number of Occurrences:   1    Standing Expiration Date:   02/13/2019  . Vitamin D (25 hydroxy)    Standing Status:   Future    Number of Occurrences:   1    Standing Expiration Date:   02/13/2019  . HgB A1c    Standing Status:   Future    Number of Occurrences:   1    Standing Expiration Date:   02/13/2019    Requested Prescriptions    No prescriptions requested or ordered in this encounter

## 2018-02-13 NOTE — Patient Instructions (Signed)
Please start checking your blood pressure everyday- please send those numbers for review in about 2 weeks;     Health Maintenance, Female Adopting a healthy lifestyle and getting preventive care can go a long way to promote health and wellness. Talk with your health care provider about what schedule of regular examinations is right for you. This is a good chance for you to check in with your provider about disease prevention and staying healthy. In between checkups, there are plenty of things you can do on your own. Experts have done a lot of research about which lifestyle changes and preventive measures are most likely to keep you healthy. Ask your health care provider for more information. Weight and diet Eat a healthy diet  Be sure to include plenty of vegetables, fruits, low-fat dairy products, and lean protein.  Do not eat a lot of foods high in solid fats, added sugars, or salt.  Get regular exercise. This is one of the most important things you can do for your health. ? Most adults should exercise for at least 150 minutes each week. The exercise should increase your heart rate and make you sweat (moderate-intensity exercise). ? Most adults should also do strengthening exercises at least twice a week. This is in addition to the moderate-intensity exercise.  Maintain a healthy weight  Body mass index (BMI) is a measurement that can be used to identify possible weight problems. It estimates body fat based on height and weight. Your health care provider can help determine your BMI and help you achieve or maintain a healthy weight.  For females 44 years of age and older: ? A BMI below 18.5 is considered underweight. ? A BMI of 18.5 to 24.9 is normal. ? A BMI of 25 to 29.9 is considered overweight. ? A BMI of 30 and above is considered obese.  Watch levels of cholesterol and blood lipids  You should start having your blood tested for lipids and cholesterol at 59 years of age, then have  this test every 5 years.  You may need to have your cholesterol levels checked more often if: ? Your lipid or cholesterol levels are high. ? You are older than 59 years of age. ? You are at high risk for heart disease.  Cancer screening Lung Cancer  Lung cancer screening is recommended for adults 22-27 years old who are at high risk for lung cancer because of a history of smoking.  A yearly low-dose CT scan of the lungs is recommended for people who: ? Currently smoke. ? Have quit within the past 15 years. ? Have at least a 30-pack-year history of smoking. A pack year is smoking an average of one pack of cigarettes a day for 1 year.  Yearly screening should continue until it has been 15 years since you quit.  Yearly screening should stop if you develop a health problem that would prevent you from having lung cancer treatment.  Breast Cancer  Practice breast self-awareness. This means understanding how your breasts normally appear and feel.  It also means doing regular breast self-exams. Let your health care provider know about any changes, no matter how small.  If you are in your 20s or 30s, you should have a clinical breast exam (CBE) by a health care provider every 1-3 years as part of a regular health exam.  If you are 43 or older, have a CBE every year. Also consider having a breast X-ray (mammogram) every year.  If you have a  family history of breast cancer, talk to your health care provider about genetic screening.  If you are at high risk for breast cancer, talk to your health care provider about having an MRI and a mammogram every year.  Breast cancer gene (BRCA) assessment is recommended for women who have family members with BRCA-related cancers. BRCA-related cancers include: ? Breast. ? Ovarian. ? Tubal. ? Peritoneal cancers.  Results of the assessment will determine the need for genetic counseling and BRCA1 and BRCA2 testing.  Cervical Cancer Your health care  provider may recommend that you be screened regularly for cancer of the pelvic organs (ovaries, uterus, and vagina). This screening involves a pelvic examination, including checking for microscopic changes to the surface of your cervix (Pap test). You may be encouraged to have this screening done every 3 years, beginning at age 36.  For women ages 27-65, health care providers may recommend pelvic exams and Pap testing every 3 years, or they may recommend the Pap and pelvic exam, combined with testing for human papilloma virus (HPV), every 5 years. Some types of HPV increase your risk of cervical cancer. Testing for HPV may also be done on women of any age with unclear Pap test results.  Other health care providers may not recommend any screening for nonpregnant women who are considered low risk for pelvic cancer and who do not have symptoms. Ask your health care provider if a screening pelvic exam is right for you.  If you have had past treatment for cervical cancer or a condition that could lead to cancer, you need Pap tests and screening for cancer for at least 20 years after your treatment. If Pap tests have been discontinued, your risk factors (such as having a new sexual partner) need to be reassessed to determine if screening should resume. Some women have medical problems that increase the chance of getting cervical cancer. In these cases, your health care provider may recommend more frequent screening and Pap tests.  Colorectal Cancer  This type of cancer can be detected and often prevented.  Routine colorectal cancer screening usually begins at 59 years of age and continues through 59 years of age.  Your health care provider may recommend screening at an earlier age if you have risk factors for colon cancer.  Your health care provider may also recommend using home test kits to check for hidden blood in the stool.  A small camera at the end of a tube can be used to examine your colon  directly (sigmoidoscopy or colonoscopy). This is done to check for the earliest forms of colorectal cancer.  Routine screening usually begins at age 47.  Direct examination of the colon should be repeated every 5-10 years through 59 years of age. However, you may need to be screened more often if early forms of precancerous polyps or small growths are found.  Skin Cancer  Check your skin from head to toe regularly.  Tell your health care provider about any new moles or changes in moles, especially if there is a change in a mole's shape or color.  Also tell your health care provider if you have a mole that is larger than the size of a pencil eraser.  Always use sunscreen. Apply sunscreen liberally and repeatedly throughout the day.  Protect yourself by wearing long sleeves, pants, a wide-brimmed hat, and sunglasses whenever you are outside.  Heart disease, diabetes, and high blood pressure  High blood pressure causes heart disease and increases the risk of  stroke. High blood pressure is more likely to develop in: ? People who have blood pressure in the high end of the normal range (130-139/85-89 mm Hg). ? People who are overweight or obese. ? People who are African American.  If you are 34-76 years of age, have your blood pressure checked every 3-5 years. If you are 28 years of age or older, have your blood pressure checked every year. You should have your blood pressure measured twice-once when you are at a hospital or clinic, and once when you are not at a hospital or clinic. Record the average of the two measurements. To check your blood pressure when you are not at a hospital or clinic, you can use: ? An automated blood pressure machine at a pharmacy. ? A home blood pressure monitor.  If you are between 33 years and 77 years old, ask your health care provider if you should take aspirin to prevent strokes.  Have regular diabetes screenings. This involves taking a blood sample to  check your fasting blood sugar level. ? If you are at a normal weight and have a low risk for diabetes, have this test once every three years after 59 years of age. ? If you are overweight and have a high risk for diabetes, consider being tested at a younger age or more often. Preventing infection Hepatitis B  If you have a higher risk for hepatitis B, you should be screened for this virus. You are considered at high risk for hepatitis B if: ? You were born in a country where hepatitis B is common. Ask your health care provider which countries are considered high risk. ? Your parents were born in a high-risk country, and you have not been immunized against hepatitis B (hepatitis B vaccine). ? You have HIV or AIDS. ? You use needles to inject street drugs. ? You live with someone who has hepatitis B. ? You have had sex with someone who has hepatitis B. ? You get hemodialysis treatment. ? You take certain medicines for conditions, including cancer, organ transplantation, and autoimmune conditions.  Hepatitis C  Blood testing is recommended for: ? Everyone born from [birth year] through 1967. ? Anyone with known risk factors for hepatitis C.  Sexually transmitted infections (STIs)  You should be screened for sexually transmitted infections (STIs) including gonorrhea and chlamydia if: ? You are sexually active and are younger than 59 years of age. ? You are older than 59 years of age and your health care provider tells you that you are at risk for this type of infection. ? Your sexual activity has changed since you were last screened and you are at an increased risk for chlamydia or gonorrhea. Ask your health care provider if you are at risk.  If you do not have HIV, but are at risk, it may be recommended that you take a prescription medicine daily to prevent HIV infection. This is called pre-exposure prophylaxis (PrEP). You are considered at risk if: ? You are sexually active and do not regularly  use condoms or know the HIV status of your partner(s). ? You take drugs by injection. ? You are sexually active with a partner who has HIV.  Talk with your health care provider about whether you are at high risk of being infected with HIV. If you choose to begin PrEP, you should first be tested for HIV. You should then be tested every 3 months for as long as you are taking PrEP. Pregnancy  If  you are premenopausal and you may become pregnant, ask your health care provider about preconception counseling.  If you may become pregnant, take 400 to 800 micrograms (mcg) of folic acid every day.  If you want to prevent pregnancy, talk to your health care provider about birth control (contraception). Osteoporosis and menopause  Osteoporosis is a disease in which the bones lose minerals and strength with aging. This can result in serious bone fractures. Your risk for osteoporosis can be identified using a bone density scan.  If you are 83 years of age or older, or if you are at risk for osteoporosis and fractures, ask your health care provider if you should be screened.  Ask your health care provider whether you should take a calcium or vitamin D supplement to lower your risk for osteoporosis.  Menopause may have certain physical symptoms and risks.  Hormone replacement therapy may reduce some of these symptoms and risks. Talk to your health care provider about whether hormone replacement therapy is right for you. Follow these instructions at home:  Schedule regular health, dental, and eye exams.  Stay current with your immunizations.  Do not use any tobacco products including cigarettes, chewing tobacco, or electronic cigarettes.  If you are pregnant, do not drink alcohol.  If you are breastfeeding, limit how much and how often you drink alcohol.  Limit alcohol intake to no more than 1 drink per day for nonpregnant women. One drink equals 12 ounces of beer, 5 ounces of wine, or 1 ounces  of hard liquor.  Do not use street drugs.  Do not share needles.  Ask your health care provider for help if you need support or information about quitting drugs.  Tell your health care provider if you often feel depressed.  Tell your health care provider if you have ever been abused or do not feel safe at home. This information is not intended to replace advice given to you by your health care provider. Make sure you discuss any questions you have with your health care provider. Document Released: 11/05/2010 Document Revised: 09/28/2015 Document Reviewed: 01/24/2015 Elsevier Interactive Patient Education  [employer].

## 2018-03-01 ENCOUNTER — Encounter: Payer: Self-pay | Admitting: Family

## 2019-02-09 ENCOUNTER — Encounter: Payer: Self-pay | Admitting: Gastroenterology

## 2019-04-30 IMAGING — CR DG HAND COMPLETE 3+V*R*
3 series · 3 of 3 positions shown · non-contrast
Comparison: None

CLINICAL DATA: Chronic RIGHT hand pain, osteoarthritis, pain is
worse at the RIGHT thumb at the MCP joint

EXAM:
RIGHT HAND - COMPLETE 3+ VIEW

[x hand pa right]
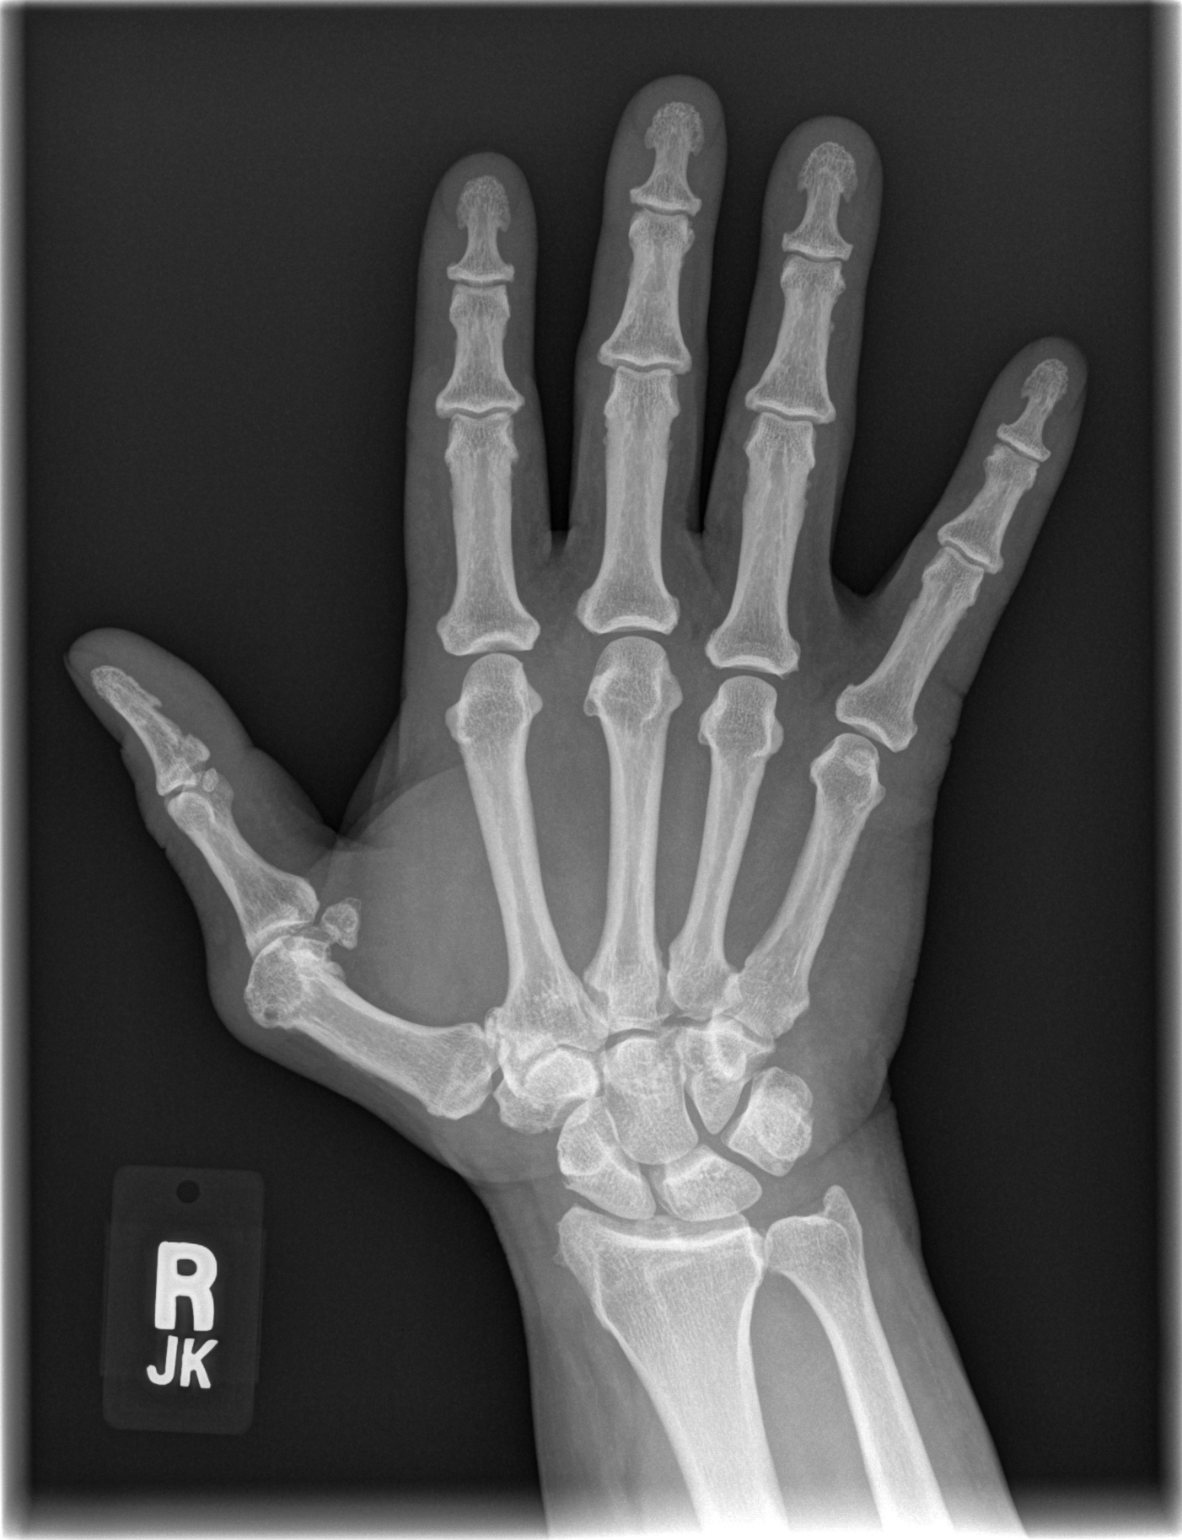

[x hand oblique right]
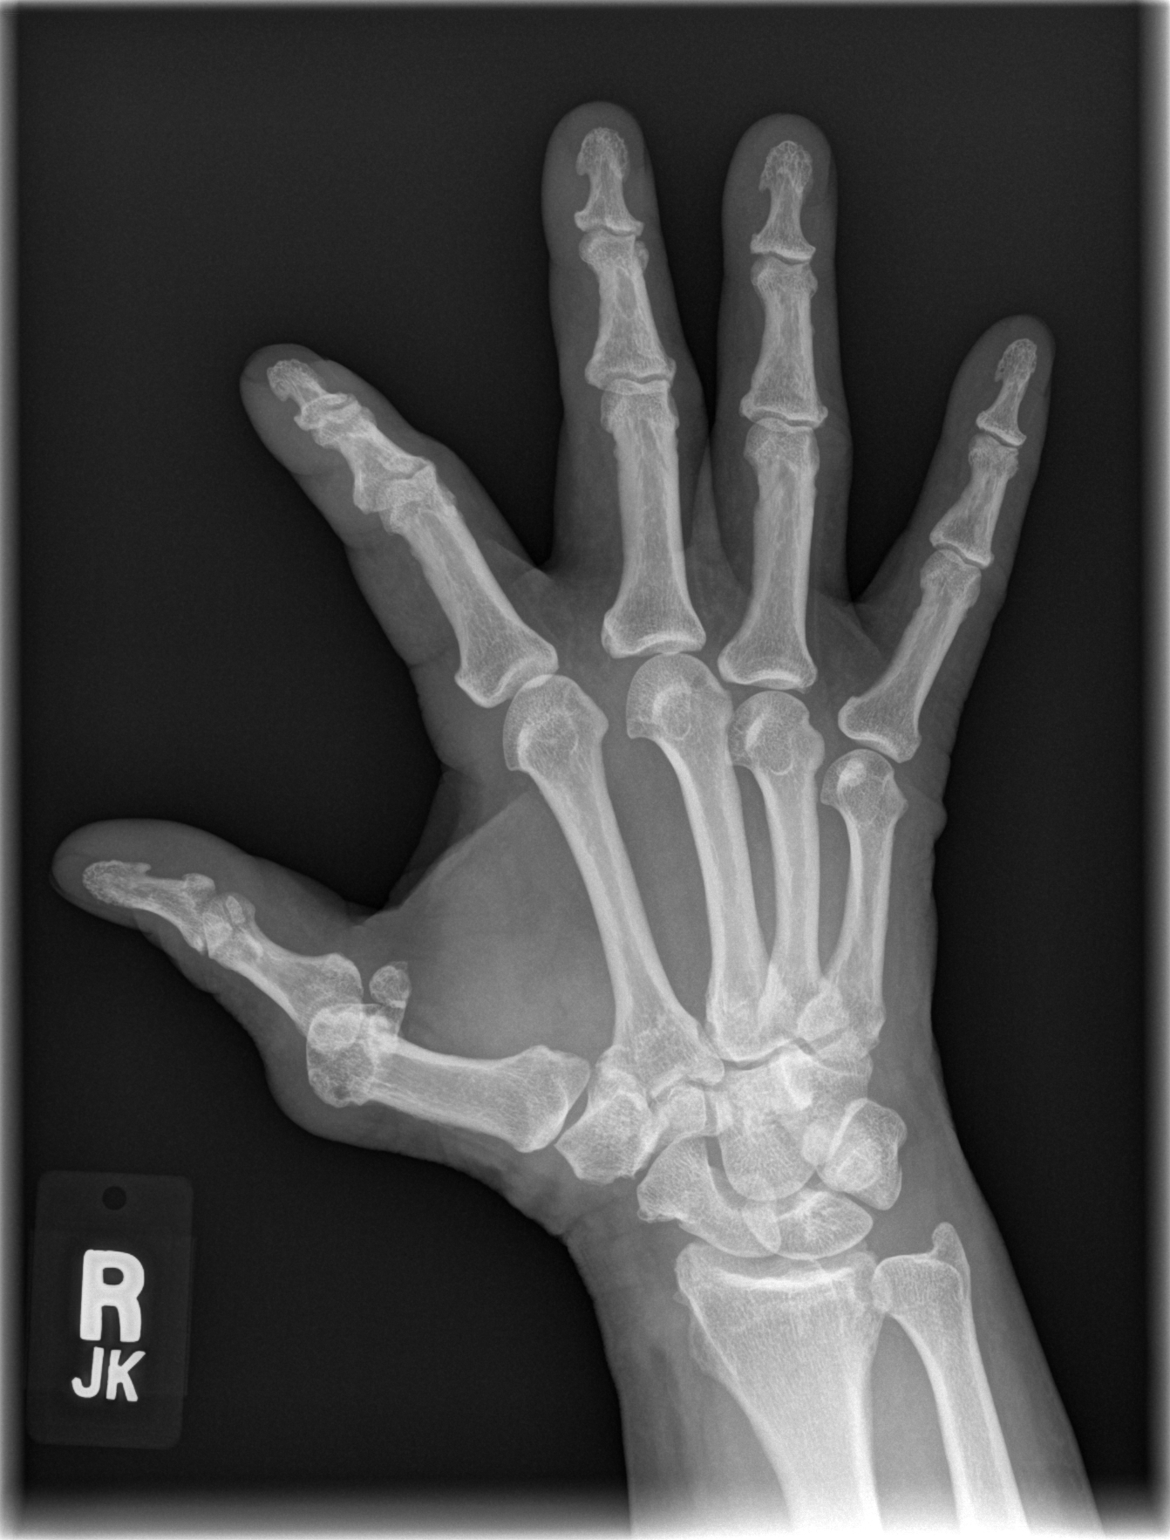

[x hand lat right]
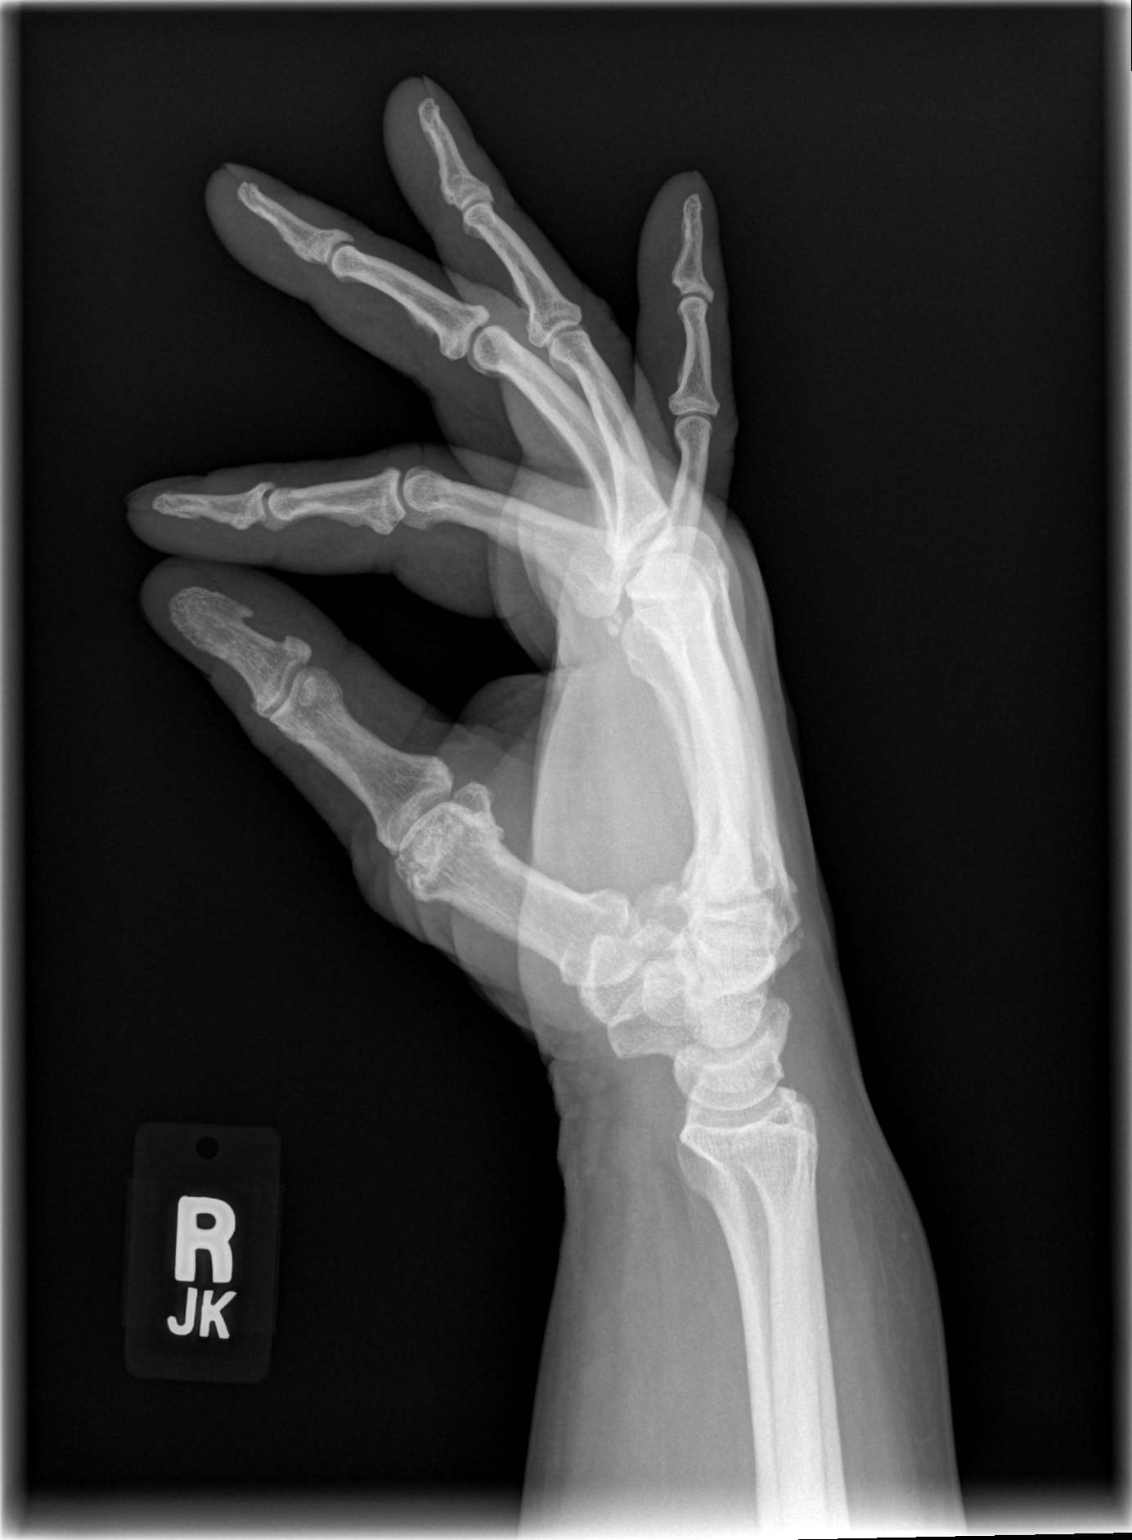

[3 of 3 positions shown; findings below may reference images not displayed]

FINDINGS: Osseous mineralization normal.

Minimal narrowing and subchondral cystic changes at the first MCP
joint.

Remaining joint spaces preserved.

No acute fracture, dislocation, or bone destruction.
IMPRESSION: Mild degenerative changes at the RIGHT first MCP joint.

## 2019-04-30 IMAGING — CR DG SHOULDER 2+V*R*
3 series · 3 of 3 positions shown · non-contrast
Comparison: None

CLINICAL DATA: Chronic RIGHT shoulder pain with decreased range of
motion, increased pain with abduction

EXAM:
RIGHT SHOULDER - 2+ VIEW

[w shoulder ap internal righ *]
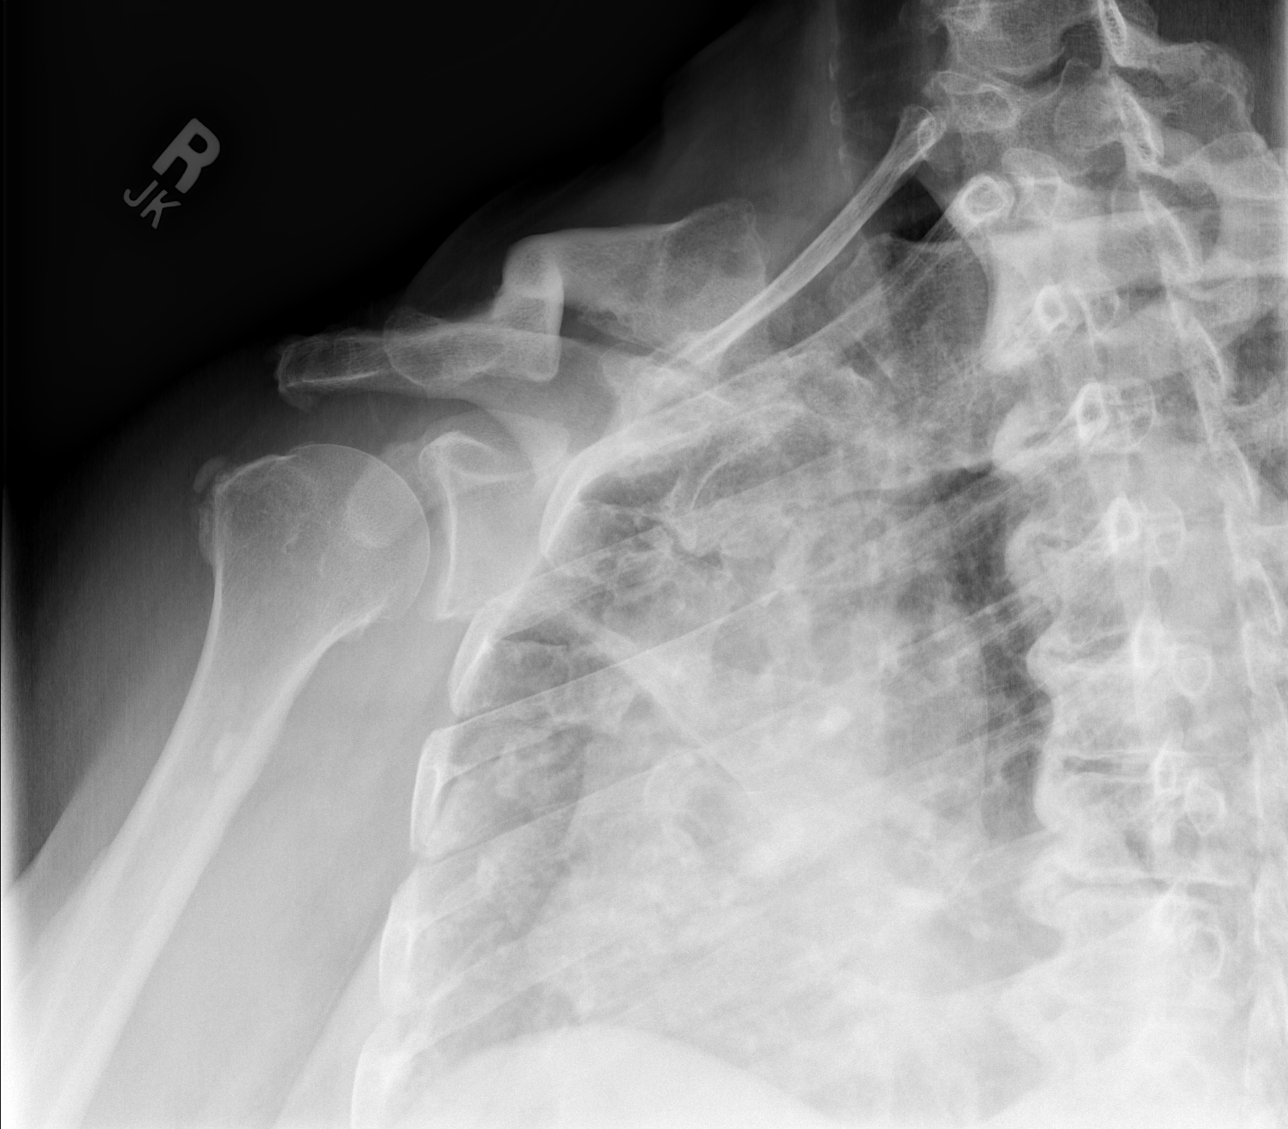

[w shoulder ap external righ *]
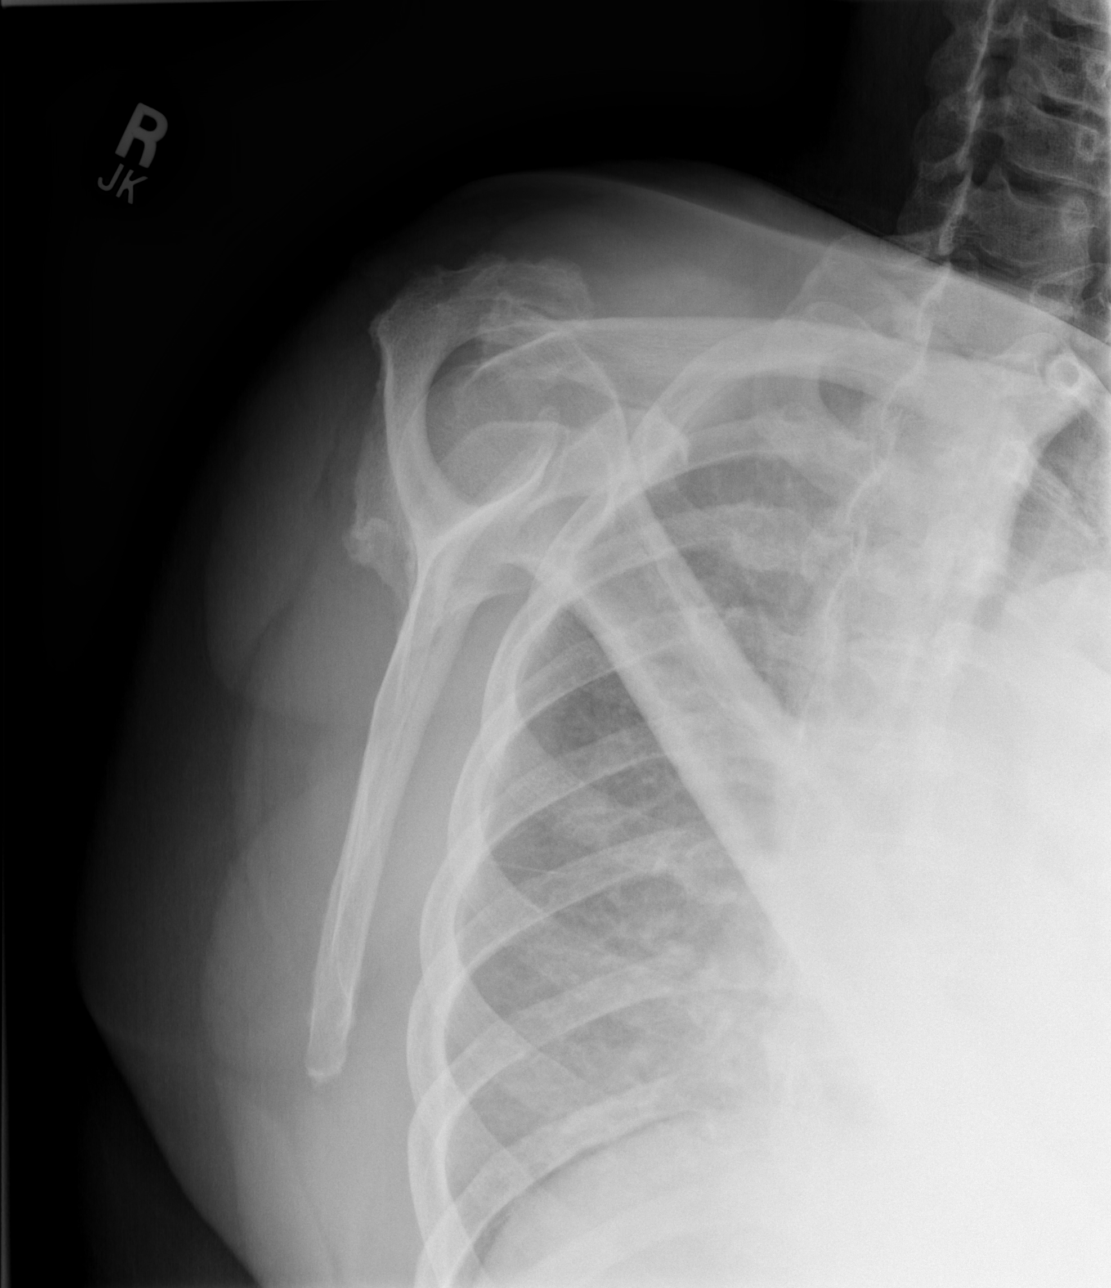

[w shoulder y view right *]
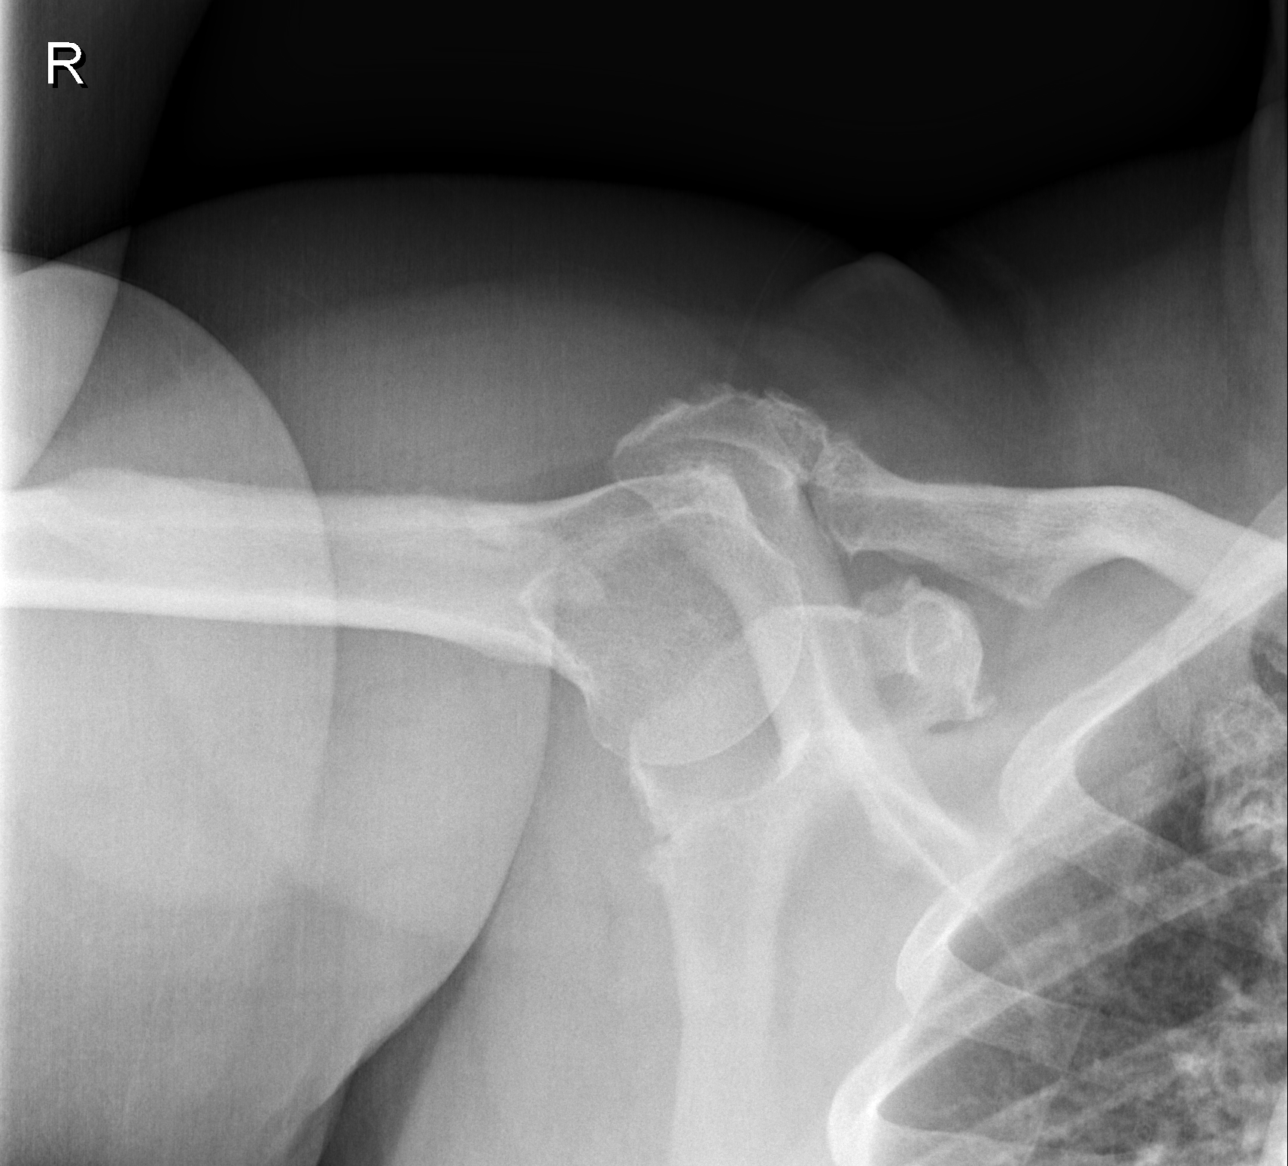

[3 of 3 positions shown; findings below may reference images not displayed]

FINDINGS: Osseous mineralization normal.

AC joint alignment normal.

No acute fracture, dislocation or bone destruction.

Calcification adjacent to greater tuberosity of humerus question
chronic calcific tendinitis of the rotator cuff.

Visualized RIGHT ribs unremarkable.

Multilevel endplate spur formation thoracic spine.
IMPRESSION: Question chronic calcific tendinitis of the RIGHT rotator cuff.

Otherwise negative exam.
